# Patient Record
Sex: Female | Born: 1960 | Hispanic: No | Smoking: Former smoker
Health system: Southern US, Community
[De-identification: ages and names within clinical notes are randomized; demographics above are authoritative.]

## PROBLEM LIST (undated history)

## (undated) DIAGNOSIS — N6310 Unspecified lump in the right breast, unspecified quadrant: Secondary | ICD-10-CM

## (undated) DIAGNOSIS — G43909 Migraine, unspecified, not intractable, without status migrainosus: Secondary | ICD-10-CM

## (undated) DIAGNOSIS — M25511 Pain in right shoulder: Secondary | ICD-10-CM

## (undated) DIAGNOSIS — I6509 Occlusion and stenosis of unspecified vertebral artery: Secondary | ICD-10-CM

## (undated) DIAGNOSIS — R911 Solitary pulmonary nodule: Secondary | ICD-10-CM

## (undated) HISTORY — DX: Occlusion and stenosis of unspecified vertebral artery: I65.09

## (undated) HISTORY — DX: Pain in right shoulder: M25.511

## (undated) HISTORY — DX: Solitary pulmonary nodule: R91.1

---

## 1998-03-19 ENCOUNTER — Ambulatory Visit (HOSPITAL_COMMUNITY): Admission: RE | Admit: 1998-03-19 | Discharge: 1998-03-19 | Payer: Self-pay | Admitting: Family Medicine

## 1998-05-03 ENCOUNTER — Ambulatory Visit (HOSPITAL_COMMUNITY): Admission: RE | Admit: 1998-05-03 | Discharge: 1998-05-03 | Payer: Self-pay | Admitting: Obstetrics

## 1998-06-21 ENCOUNTER — Ambulatory Visit (HOSPITAL_COMMUNITY): Admission: RE | Admit: 1998-06-21 | Discharge: 1998-06-21 | Payer: Self-pay | Admitting: *Deleted

## 1998-11-05 ENCOUNTER — Inpatient Hospital Stay (HOSPITAL_COMMUNITY): Admission: AD | Admit: 1998-11-05 | Discharge: 1998-11-05 | Payer: Self-pay | Admitting: *Deleted

## 1998-11-08 ENCOUNTER — Inpatient Hospital Stay (HOSPITAL_COMMUNITY): Admission: AD | Admit: 1998-11-08 | Discharge: 1998-11-11 | Payer: Self-pay | Admitting: Obstetrics & Gynecology

## 1998-11-09 HISTORY — PX: TUBAL LIGATION: SHX77

## 2000-02-18 ENCOUNTER — Encounter: Payer: Self-pay | Admitting: Emergency Medicine

## 2000-02-18 ENCOUNTER — Emergency Department (HOSPITAL_COMMUNITY): Admission: EM | Admit: 2000-02-18 | Discharge: 2000-02-18 | Payer: Self-pay | Admitting: Emergency Medicine

## 2000-06-08 ENCOUNTER — Ambulatory Visit (HOSPITAL_COMMUNITY): Admission: RE | Admit: 2000-06-08 | Discharge: 2000-06-08 | Payer: Self-pay | Admitting: Internal Medicine

## 2001-03-08 ENCOUNTER — Encounter: Payer: Self-pay | Admitting: Family Medicine

## 2001-03-08 ENCOUNTER — Ambulatory Visit (HOSPITAL_COMMUNITY): Admission: RE | Admit: 2001-03-08 | Discharge: 2001-03-08 | Payer: Self-pay | Admitting: Family Medicine

## 2002-02-04 ENCOUNTER — Emergency Department (HOSPITAL_COMMUNITY): Admission: EM | Admit: 2002-02-04 | Discharge: 2002-02-04 | Payer: Self-pay | Admitting: Emergency Medicine

## 2002-07-15 ENCOUNTER — Encounter: Admission: RE | Admit: 2002-07-15 | Discharge: 2002-07-15 | Payer: Self-pay | Admitting: Family Medicine

## 2002-07-15 ENCOUNTER — Encounter: Payer: Self-pay | Admitting: Family Medicine

## 2002-09-25 ENCOUNTER — Emergency Department (HOSPITAL_COMMUNITY): Admission: EM | Admit: 2002-09-25 | Discharge: 2002-09-25 | Payer: Self-pay | Admitting: Emergency Medicine

## 2003-07-05 ENCOUNTER — Encounter: Admission: RE | Admit: 2003-07-05 | Discharge: 2003-07-05 | Payer: Self-pay | Admitting: Family Medicine

## 2003-07-05 ENCOUNTER — Encounter: Payer: Self-pay | Admitting: Family Medicine

## 2005-04-09 ENCOUNTER — Encounter: Admission: RE | Admit: 2005-04-09 | Discharge: 2005-04-09 | Payer: Self-pay | Admitting: Emergency Medicine

## 2011-07-04 ENCOUNTER — Other Ambulatory Visit: Payer: Self-pay | Admitting: Family Medicine

## 2011-07-09 ENCOUNTER — Other Ambulatory Visit: Payer: Self-pay

## 2011-07-09 ENCOUNTER — Emergency Department (HOSPITAL_COMMUNITY)
Admission: EM | Admit: 2011-07-09 | Discharge: 2011-07-09 | Disposition: A | Discharge status: Home / Self Care | Payer: Medicaid Other | Attending: Emergency Medicine | Admitting: Emergency Medicine

## 2011-07-09 DIAGNOSIS — M545 Low back pain, unspecified: Secondary | ICD-10-CM | POA: Insufficient documentation

## 2011-07-23 ENCOUNTER — Other Ambulatory Visit: Payer: Self-pay | Admitting: Family Medicine

## 2011-07-23 DIAGNOSIS — Z1231 Encounter for screening mammogram for malignant neoplasm of breast: Secondary | ICD-10-CM

## 2011-07-25 ENCOUNTER — Other Ambulatory Visit: Payer: Self-pay | Admitting: Family Medicine

## 2011-07-25 ENCOUNTER — Ambulatory Visit
Admission: RE | Admit: 2011-07-25 | Discharge: 2011-07-25 | Disposition: A | Discharge status: Home / Self Care | Payer: Medicaid Other | Source: Ambulatory Visit | Attending: Family Medicine | Admitting: Family Medicine

## 2011-07-25 ENCOUNTER — Ambulatory Visit: Payer: Medicaid Other

## 2011-07-30 ENCOUNTER — Other Ambulatory Visit: Payer: Medicaid Other

## 2011-07-30 ENCOUNTER — Other Ambulatory Visit: Payer: Self-pay | Admitting: Family Medicine

## 2011-07-30 ENCOUNTER — Ambulatory Visit
Admission: RE | Admit: 2011-07-30 | Discharge: 2011-07-30 | Disposition: A | Discharge status: Home / Self Care | Payer: Medicaid Other | Source: Ambulatory Visit | Attending: Family Medicine | Admitting: Family Medicine

## 2011-07-30 ENCOUNTER — Other Ambulatory Visit: Payer: Self-pay | Admitting: Diagnostic Radiology

## 2011-07-31 ENCOUNTER — Ambulatory Visit
Admission: RE | Admit: 2011-07-31 | Discharge: 2011-07-31 | Disposition: A | Discharge status: Home / Self Care | Payer: Medicaid Other | Source: Ambulatory Visit | Attending: Family Medicine | Admitting: Family Medicine

## 2011-08-11 ENCOUNTER — Ambulatory Visit (INDEPENDENT_AMBULATORY_CARE_PROVIDER_SITE_OTHER): Payer: Medicaid Other | Admitting: Surgery

## 2011-08-11 ENCOUNTER — Encounter (INDEPENDENT_AMBULATORY_CARE_PROVIDER_SITE_OTHER): Payer: Self-pay | Admitting: Surgery

## 2011-08-11 VITALS — BP 122/80 | HR 64 | Temp 96.9°F | Resp 14 | Ht 64.0 in | Wt 144.8 lb

## 2011-08-11 DIAGNOSIS — N63 Unspecified lump in unspecified breast: Secondary | ICD-10-CM

## 2011-08-11 DIAGNOSIS — N6310 Unspecified lump in the right breast, unspecified quadrant: Secondary | ICD-10-CM

## 2011-08-11 NOTE — Progress Notes (Signed)
Chief Complaint  Patient presents with  . Other    Eval of right breast mass    HPI Jamie Madden is a 50 y.o. female.   HPIPleasant female referred by Dr. Guinevere Ferrari for evaluation of a right breast mass. She palpated a mass herself recently. She has since had mammograms and ultrasounds as well as biopsy of the mass. She has no previous history of breast problems or breast surgery  Past Medical History  Diagnosis Date  . Arthritis   . Family history of breast cancer     cousin    Past Surgical History  Procedure Date  . Tubal ligation 11/09/1998    Family History  Problem Relation Age of Onset  . Heart disease Father 7    heart attack  . Cancer Maternal Aunt     breast    Social History History  Substance Use Topics  . Smoking status: Current Everyday Smoker -- 0.5 packs/day  . Smokeless tobacco: Never Used  . Alcohol Use: No    Allergies  Allergen Reactions  . Aspirin Nausea Only and Other (See Comments)    Stomach cramps    No current outpatient prescriptions on file.    Review of Systems Review of Systems  Constitutional: Negative.   HENT: Negative.   Eyes: Negative.   Respiratory: Negative.   Cardiovascular: Negative.   Gastrointestinal: Negative.   Genitourinary: Negative.   Musculoskeletal: Negative.   Neurological: Negative.   Hematological: Negative.   Psychiatric/Behavioral: Negative.     Blood pressure 122/80, pulse 64, temperature 96.9 F (36.1 C), temperature source Temporal, resp. rate 14, height 5\' 4"  (1.626 m), weight 144 lb 12.8 oz (65.681 kg).  Physical Exam Physical Exam  Constitutional: She is oriented to person, place, and time. She appears well-developed and well-nourished. No distress.  HENT:  Head: Normocephalic and atraumatic.  Right Ear: External ear normal.  Left Ear: External ear normal.  Nose: Nose normal.  Mouth/Throat: Oropharynx is clear and moist. No oropharyngeal exudate.  Eyes: Conjunctivae are normal. Pupils  are equal, round, and reactive to light. No scleral icterus.  Neck: Normal range of motion. Neck supple. No tracheal deviation present. No thyromegaly present.  Cardiovascular: Normal rate, regular rhythm, normal heart sounds and intact distal pulses.   No murmur heard. Pulmonary/Chest: Effort normal and breath sounds normal. She has no wheezes.  Abdominal: Soft. Bowel sounds are normal. She exhibits no distension. There is no tenderness.  Musculoskeletal: Normal range of motion. She exhibits no edema and no tenderness.  Lymphadenopathy:    She has no cervical adenopathy.  Neurological: She is alert and oriented to person, place, and time.  Skin: Skin is warm and dry. No rash noted. No erythema.  Psychiatric: Her behavior is normal. Judgment normal.   A bilateral breast examination was performed. There are no palpable left breast masses. There is a 1-1/2-2 cm firm mobile mass at the 3:00 position of the right breast. There is no axillary enlarged lymph nodes on either side. A real are normal. 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 I discussed this with her in detail. She is eager to proceed given her family history of breast cancer. I discussed the risks  of surgery with her which includes, but is not limited to, bleeding, infection, injury to surrounding structures, need for further surgery if cancer is present, et Karie Soda. Likelihood of complete removal and surgical success is likely. Surgery was scheduled       Pinkie Manger A 08/11/2011, 4:41 PM

## 2011-08-14 ENCOUNTER — Encounter (HOSPITAL_COMMUNITY)
Admission: RE | Admit: 2011-08-14 | Discharge: 2011-08-14 | Disposition: A | Discharge status: Home / Self Care | Payer: Medicaid Other | Source: Ambulatory Visit | Attending: Surgery | Admitting: Surgery

## 2011-08-14 LAB — CBC
MCH: 30.1 pg (ref 26.0–34.0)
MCHC: 33.7 g/dL (ref 30.0–36.0)
MCV: 89.4 fL (ref 78.0–100.0)
Platelets: 224 10*3/uL (ref 150–400)
RDW: 12.4 % (ref 11.5–15.5)
WBC: 9.1 10*3/uL (ref 4.0–10.5)

## 2011-08-14 LAB — SURGICAL PCR SCREEN: Staphylococcus aureus: NEGATIVE

## 2011-08-20 ENCOUNTER — Other Ambulatory Visit (INDEPENDENT_AMBULATORY_CARE_PROVIDER_SITE_OTHER): Payer: Self-pay | Admitting: Surgery

## 2011-08-20 ENCOUNTER — Ambulatory Visit (HOSPITAL_COMMUNITY)
Admission: RE | Admit: 2011-08-20 | Discharge: 2011-08-20 | Disposition: A | Discharge status: Home / Self Care | Payer: Medicaid Other | Source: Ambulatory Visit | Attending: Surgery | Admitting: Surgery

## 2011-08-20 DIAGNOSIS — G43909 Migraine, unspecified, not intractable, without status migrainosus: Secondary | ICD-10-CM | POA: Insufficient documentation

## 2011-08-20 DIAGNOSIS — M129 Arthropathy, unspecified: Secondary | ICD-10-CM | POA: Insufficient documentation

## 2011-08-20 DIAGNOSIS — F17200 Nicotine dependence, unspecified, uncomplicated: Secondary | ICD-10-CM | POA: Insufficient documentation

## 2011-08-20 DIAGNOSIS — D249 Benign neoplasm of unspecified breast: Secondary | ICD-10-CM | POA: Insufficient documentation

## 2011-08-20 DIAGNOSIS — N6019 Diffuse cystic mastopathy of unspecified breast: Secondary | ICD-10-CM

## 2011-08-20 DIAGNOSIS — Z01812 Encounter for preprocedural laboratory examination: Secondary | ICD-10-CM | POA: Insufficient documentation

## 2011-08-20 HISTORY — PX: BREAST MASS EXCISION: SHX1267

## 2011-08-21 NOTE — Op Note (Signed)
  NAMESCHUYLER, [name]                  ACCOUNT NO.:  [id number]  MEDICAL RECORD NO.:  [id number]  LOCATION:  [facility]                         FACILITY:  [facility]  PHYSICIAN:  Abigail Miyamoto, M.D. DATE OF BIRTH:  1960/12/25  DATE OF PROCEDURE:  08/20/2011 DATE OF DISCHARGE:                              OPERATIVE REPORT   PREOPERATIVE DIAGNOSIS:  Right breast mass.  POSTOPERATIVE DIAGNOSIS:  Right breast mass.  PROCEDURES:  Excision of right breast mass.  SURGEON:  Abigail Miyamoto, MD  ANESTHESIA:  General and 0.25% Marcaine.  ESTIMATED BLOOD LOSS:  Minimal.  PROCEDURE IN DETAIL:  The patient was brought to the operative room, identified as Jamie Madden.  She was placed on the operating table and general seizures induced.  Her right breast was prepped and draped in the usual sterile fashion.  The palpable mass was located at 3 o'clock position of the breast several inches from the areola.  I anesthetized the skin over the top of the Marcaine.  I then made a transverse incision on the breast with a scalpel.  I took this down to the breast tissue with electrocautery.  The mass itself appeared to be consistent with fibroadenoma and was removed in its entirety with the electrocautery.  I then achieved hemostasis with cautery.  I anesthetized the wound further with Marcaine.  I then closed subcutaneous tissue with interrupted 3-0 Vicryl sutures, closed the skin with running 4-0 Monocryl.  Steri-Strips, gauze, and Tegaderm were then applied.  The patient tolerated the procedure well.  All counts were correct at the end of procedure.  The specimen was sent to pathology for evaluation.  The patient was then extubated in the operating room and sent to the recovery room in stable condition.     Abigail Miyamoto, M.D.     DB/MEDQ  D:  08/20/2011  T:  08/20/2011  Job:  [id number]  Electronically Signed by Abigail Miyamoto M.D. on 08/21/2011 12:36:05 PM

## 2011-08-26 ENCOUNTER — Encounter (INDEPENDENT_AMBULATORY_CARE_PROVIDER_SITE_OTHER): Payer: Self-pay | Admitting: Surgery

## 2011-09-01 ENCOUNTER — Encounter (INDEPENDENT_AMBULATORY_CARE_PROVIDER_SITE_OTHER): Payer: Self-pay | Admitting: Surgery

## 2011-09-01 ENCOUNTER — Other Ambulatory Visit (INDEPENDENT_AMBULATORY_CARE_PROVIDER_SITE_OTHER): Payer: Self-pay | Admitting: Surgery

## 2011-09-01 ENCOUNTER — Ambulatory Visit (INDEPENDENT_AMBULATORY_CARE_PROVIDER_SITE_OTHER): Payer: Medicaid Other | Admitting: Surgery

## 2011-09-01 VITALS — BP 132/96 | HR 68 | Temp 96.0°F | Resp 18 | Ht 64.0 in | Wt 145.2 lb

## 2011-09-01 DIAGNOSIS — Z853 Personal history of malignant neoplasm of breast: Secondary | ICD-10-CM | POA: Insufficient documentation

## 2011-09-01 DIAGNOSIS — Z09 Encounter for follow-up examination after completed treatment for conditions other than malignant neoplasm: Secondary | ICD-10-CM

## 2011-09-01 NOTE — Progress Notes (Signed)
Subjective:     Patient ID: Jamie Madden, female   DOB: 06-08-1961, 50 y.o.   MRN: [id number]  HPI She is here for her first postoperative visit status post excision of a right breast mass. She has no complaints and is doing well.  Review of Systems     Objective:   Physical Exam On examination, the incision on the right breast is well-healed without evidence of infection.  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 After discussion she would like to go ahead and reexcise the area. I discussed the risks of surgery which includes bleeding, infection, need for further surgery, et Karie Soda. Likelihood of success is good. Surgery will be scheduled

## 2011-09-12 ENCOUNTER — Encounter (HOSPITAL_BASED_OUTPATIENT_CLINIC_OR_DEPARTMENT_OTHER): Payer: Self-pay | Admitting: *Deleted

## 2011-09-12 NOTE — Pre-Procedure Instructions (Addendum)
States small cut on finger left hand  PCP - [facility]

## 2011-09-17 ENCOUNTER — Encounter (HOSPITAL_BASED_OUTPATIENT_CLINIC_OR_DEPARTMENT_OTHER): Payer: Self-pay

## 2011-09-17 ENCOUNTER — Ambulatory Visit (HOSPITAL_BASED_OUTPATIENT_CLINIC_OR_DEPARTMENT_OTHER): Payer: Medicaid Other | Admitting: Anesthesiology

## 2011-09-17 ENCOUNTER — Encounter (HOSPITAL_BASED_OUTPATIENT_CLINIC_OR_DEPARTMENT_OTHER)
Admission: RE | Disposition: A | Discharge status: Home / Self Care | Payer: Self-pay | Source: Ambulatory Visit | Attending: Surgery

## 2011-09-17 ENCOUNTER — Encounter (HOSPITAL_BASED_OUTPATIENT_CLINIC_OR_DEPARTMENT_OTHER): Payer: Self-pay | Admitting: Anesthesiology

## 2011-09-17 ENCOUNTER — Ambulatory Visit (HOSPITAL_BASED_OUTPATIENT_CLINIC_OR_DEPARTMENT_OTHER)
Admission: RE | Admit: 2011-09-17 | Discharge: 2011-09-17 | Disposition: A | Discharge status: Home / Self Care | Payer: Medicaid Other | Source: Ambulatory Visit | Attending: Surgery | Admitting: Surgery

## 2011-09-17 ENCOUNTER — Other Ambulatory Visit (INDEPENDENT_AMBULATORY_CARE_PROVIDER_SITE_OTHER): Payer: Self-pay | Admitting: Surgery

## 2011-09-17 DIAGNOSIS — N6019 Diffuse cystic mastopathy of unspecified breast: Secondary | ICD-10-CM

## 2011-09-17 DIAGNOSIS — D249 Benign neoplasm of unspecified breast: Secondary | ICD-10-CM | POA: Insufficient documentation

## 2011-09-17 DIAGNOSIS — F17200 Nicotine dependence, unspecified, uncomplicated: Secondary | ICD-10-CM | POA: Insufficient documentation

## 2011-09-17 HISTORY — PX: BREAST BIOPSY: SHX20

## 2011-09-17 HISTORY — DX: Migraine, unspecified, not intractable, without status migrainosus: G43.909

## 2011-09-17 HISTORY — DX: Unspecified lump in the right breast, unspecified quadrant: N63.10

## 2011-09-17 LAB — POCT HEMOGLOBIN-HEMACUE: Hemoglobin: 14.8 g/dL (ref 12.0–15.0)

## 2011-09-17 SURGERY — BREAST BIOPSY
Anesthesia: Choice | Site: Breast | Laterality: Right | Wound class: Clean

## 2011-09-17 MED ORDER — DROPERIDOL 2.5 MG/ML IJ SOLN
INTRAMUSCULAR | Status: DC | PRN
  Administered 2011-09-17: 0.625 mg via INTRAVENOUS

## 2011-09-17 MED ORDER — BUPIVACAINE-EPINEPHRINE 0.5% -1:200000 IJ SOLN
INTRAMUSCULAR | Status: DC | PRN
Start: 2011-09-17 — End: 2011-09-17
  Administered 2011-09-17: 15 mL

## 2011-09-17 MED ORDER — DEXAMETHASONE SODIUM PHOSPHATE 4 MG/ML IJ SOLN
INTRAMUSCULAR | Status: DC | PRN
  Administered 2011-09-17: 10 mg via INTRAVENOUS

## 2011-09-17 MED ORDER — OXYCODONE HCL 5 MG PO TABS: 5.0000 mg | ORAL_TABLET | ORAL | Status: DC | PRN

## 2011-09-17 MED ORDER — CEFAZOLIN SODIUM 1-5 GM-% IV SOLN
1.0000 g | INTRAVENOUS | Status: AC
  Administered 2011-09-17: 1 g via INTRAVENOUS

## 2011-09-17 MED ORDER — LIDOCAINE-PRILOCAINE 2.5-2.5 % EX CREA: 1.0000 "application " | TOPICAL_CREAM | Freq: Once | CUTANEOUS | Status: DC

## 2011-09-17 MED ORDER — ONDANSETRON HCL 4 MG/2ML IJ SOLN
INTRAMUSCULAR | Status: DC | PRN
  Administered 2011-09-17: 4 mg via INTRAVENOUS

## 2011-09-17 MED ORDER — PROMETHAZINE HCL 25 MG/ML IJ SOLN: 12.5000 mg | Freq: Four times a day (QID) | INTRAMUSCULAR | Status: DC | PRN

## 2011-09-17 MED ORDER — IBUPROFEN 200 MG PO TABS: 200.0000 mg | ORAL_TABLET | Freq: Four times a day (QID) | ORAL | Status: DC | PRN

## 2011-09-17 MED ORDER — SODIUM CHLORIDE 0.9 % IJ SOLN: 3.0000 mL | Freq: Two times a day (BID) | INTRAMUSCULAR | Status: DC

## 2011-09-17 MED ORDER — LACTATED RINGERS IV SOLN: 500.0000 mL | INTRAVENOUS | Status: DC

## 2011-09-17 MED ORDER — LACTATED RINGERS IV SOLN
INTRAVENOUS | Status: DC
  Administered 2011-09-17 (×2): via INTRAVENOUS

## 2011-09-17 MED ORDER — MORPHINE SULFATE 2 MG/ML IJ SOLN: 0.0500 mg/kg | INTRAMUSCULAR | Status: DC | PRN

## 2011-09-17 MED ORDER — MIDAZOLAM HCL 2 MG/2ML IJ SOLN: 1.0000 mg | INTRAMUSCULAR | Status: DC | PRN

## 2011-09-17 MED ORDER — FENTANYL CITRATE 0.05 MG/ML IJ SOLN
INTRAMUSCULAR | Status: DC | PRN
  Administered 2011-09-17: 100 ug via INTRAVENOUS

## 2011-09-17 MED ORDER — ACETAMINOPHEN 325 MG PO TABS: 650.0000 mg | ORAL_TABLET | ORAL | Status: DC | PRN

## 2011-09-17 MED ORDER — HYDROCODONE-ACETAMINOPHEN 5-325 MG PO TABS: 1.0000 | ORAL_TABLET | ORAL | Status: AC | PRN

## 2011-09-17 MED ORDER — OXYMETAZOLINE HCL 0.05 % NA SOLN: 2.0000 | Freq: Once | NASAL | Status: DC

## 2011-09-17 MED ORDER — MIDAZOLAM HCL 2 MG/2ML IJ SOLN: 0.5000 mg | INTRAMUSCULAR | Status: DC | PRN

## 2011-09-17 MED ORDER — KETOROLAC TROMETHAMINE 30 MG/ML IJ SOLN: 15.0000 mg | Freq: Once | INTRAMUSCULAR | Status: DC | PRN

## 2011-09-17 MED ORDER — SODIUM CHLORIDE 0.9 % IV SOLN: 250.0000 mL | INTRAVENOUS | Status: DC

## 2011-09-17 MED ORDER — MIDAZOLAM HCL 5 MG/5ML IJ SOLN
INTRAMUSCULAR | Status: DC | PRN
  Administered 2011-09-17: 2 mg via INTRAVENOUS

## 2011-09-17 MED ORDER — ACETAMINOPHEN 650 MG RE SUPP: 650.0000 mg | RECTAL | Status: DC | PRN

## 2011-09-17 MED ORDER — LIDOCAINE HCL (CARDIAC) 20 MG/ML IV SOLN
INTRAVENOUS | Status: DC | PRN
  Administered 2011-09-17: 60 mg via INTRAVENOUS

## 2011-09-17 MED ORDER — FENTANYL CITRATE 0.05 MG/ML IJ SOLN: 25.0000 ug | INTRAMUSCULAR | Status: DC | PRN

## 2011-09-17 MED ORDER — PROPOFOL 10 MG/ML IV EMUL
INTRAVENOUS | Status: DC | PRN
  Administered 2011-09-17: 200 mg via INTRAVENOUS

## 2011-09-17 MED ORDER — ONDANSETRON HCL 4 MG/2ML IJ SOLN: 4.0000 mg | Freq: Four times a day (QID) | INTRAMUSCULAR | Status: DC | PRN

## 2011-09-17 MED ORDER — SODIUM CHLORIDE 0.9 % IJ SOLN: 3.0000 mL | INTRAMUSCULAR | Status: DC | PRN

## 2011-09-17 MED ORDER — METOCLOPRAMIDE HCL 5 MG/ML IJ SOLN: 10.0000 mg | Freq: Once | INTRAMUSCULAR | Status: DC | PRN

## 2011-09-17 MED ORDER — ACETAMINOPHEN 10 MG/ML IV SOLN
1000.0000 mg | Freq: Once | INTRAVENOUS | Status: AC
  Administered 2011-09-17 (×2): 1000 mg via INTRAVENOUS

## 2011-09-17 MED ORDER — LACTATED RINGERS IV SOLN: INTRAVENOUS | Status: DC

## 2011-09-17 SURGICAL SUPPLY — 47 items
APL SKNCLS STERI-STRIP NONHPOA (GAUZE/BANDAGES/DRESSINGS) ×1
BENZOIN TINCTURE PRP APPL 2/3 (GAUZE/BANDAGES/DRESSINGS) ×2 IMPLANT
BLADE HEX COATED 2.75 (ELECTRODE) ×2 IMPLANT
BLADE SURG 15 STRL LF DISP TIS (BLADE) ×1 IMPLANT
BLADE SURG 15 STRL SS (BLADE) ×2
CANISTER SUCTION 1200CC (MISCELLANEOUS) IMPLANT
CHLORAPREP W/TINT 26ML (MISCELLANEOUS) ×2 IMPLANT
CLIP TI WIDE RED SMALL 6 (CLIP) IMPLANT
CLOTH BEACON ORANGE TIMEOUT ST (SAFETY) ×2 IMPLANT
COVER MAYO STAND STRL (DRAPES) ×2 IMPLANT
COVER TABLE BACK 60X90 (DRAPES) ×2 IMPLANT
DECANTER SPIKE VIAL GLASS SM (MISCELLANEOUS) IMPLANT
DEVICE DUBIN W/COMP PLATE 8390 (MISCELLANEOUS) IMPLANT
DRAPE PED LAPAROTOMY (DRAPES) ×2 IMPLANT
DRAPE UTILITY XL STRL (DRAPES) ×2 IMPLANT
DRSG TEGADERM 4X4.75 (GAUZE/BANDAGES/DRESSINGS) ×2 IMPLANT
ELECT REM PT RETURN 9FT ADLT (ELECTROSURGICAL) ×2
ELECTRODE REM PT RTRN 9FT ADLT (ELECTROSURGICAL) ×1 IMPLANT
GAUZE SPONGE 4X4 12PLY STRL LF (GAUZE/BANDAGES/DRESSINGS) ×2 IMPLANT
GLOVE BIO SURGEON STRL SZ7 (GLOVE) ×1 IMPLANT
GLOVE BIOGEL PI IND STRL 6.5 (GLOVE) IMPLANT
GLOVE BIOGEL PI INDICATOR 6.5 (GLOVE) ×1
GLOVE SKINSENSE NS SZ7.0 (GLOVE) ×1
GLOVE SKINSENSE STRL SZ7.0 (GLOVE) IMPLANT
GLOVE SURG SIGNA 7.5 PF LTX (GLOVE) ×2 IMPLANT
GOWN PREVENTION PLUS XLARGE (GOWN DISPOSABLE) ×1 IMPLANT
GOWN PREVENTION PLUS XXLARGE (GOWN DISPOSABLE) ×2 IMPLANT
KIT MARKER MARGIN INK (KITS) ×2 IMPLANT
NDL HYPO 25X1 1.5 SAFETY (NEEDLE) ×1 IMPLANT
NEEDLE HYPO 25X1 1.5 SAFETY (NEEDLE) ×2 IMPLANT
NS IRRIG 1000ML POUR BTL (IV SOLUTION) ×2 IMPLANT
PACK BASIN DAY SURGERY FS (CUSTOM PROCEDURE TRAY) ×2 IMPLANT
PENCIL BUTTON HOLSTER BLD 10FT (ELECTRODE) ×2 IMPLANT
SLEEVE SCD COMPRESS KNEE MED (MISCELLANEOUS) IMPLANT
SPONGE GAUZE 4X4 12PLY (GAUZE/BANDAGES/DRESSINGS) ×1 IMPLANT
SPONGE LAP 4X18 X RAY DECT (DISPOSABLE) ×2 IMPLANT
STRIP CLOSURE SKIN 1/2X4 (GAUZE/BANDAGES/DRESSINGS) ×2 IMPLANT
SUT MNCRL AB 4-0 PS2 18 (SUTURE) ×2 IMPLANT
SUT SILK 2 0 SH (SUTURE) ×2 IMPLANT
SUT VIC AB 3-0 SH 27 (SUTURE) ×2
SUT VIC AB 3-0 SH 27X BRD (SUTURE) ×1 IMPLANT
SYR CONTROL 10ML LL (SYRINGE) ×2 IMPLANT
TOWEL OR 17X24 6PK STRL BLUE (TOWEL DISPOSABLE) ×2 IMPLANT
TOWEL OR NON WOVEN STRL DISP B (DISPOSABLE) ×2 IMPLANT
TUBE CONNECTING 20X1/4 (TUBING) IMPLANT
WATER STERILE IRR 1000ML POUR (IV SOLUTION) ×1 IMPLANT
YANKAUER SUCT BULB TIP NO VENT (SUCTIONS) IMPLANT

## 2011-09-17 NOTE — Op Note (Signed)
09/17/2011  10:55 AM  PATIENT:  Jamie Madden  50 y.o. female  PRE-OPERATIVE DIAGNOSIS:  right breast mass  POST-OPERATIVE DIAGNOSIS:  right breast mass  PROCEDURE:  Procedure(s): BREAST BIOPSY  SURGEON:  Surgeon(s): Shelly Rubenstein, MD  PHYSICIAN ASSISTANT:   ASSISTANTS: none   ANESTHESIA:   local and general  EBL:  Total I/O In: 200 [I.V.:200] Out: -   BLOOD ADMINISTERED:none  DRAINS: none   LOCAL MEDICATIONS USED:  MARCAINE 15CC  SPECIMEN:  Excision  DISPOSITION OF SPECIMEN:  PATHOLOGY  COUNTS:  YES  TOURNIQUET:  * No tourniquets in log *  DICTATION: .Dragon Dictation The patient was brought to the operating room and identified as the correct patient. She was placed on the operating room table and anesthesia was induced. Her right breast and prepped and draped in the usual sterile fashion. I anesthetized the old incision with Marcaine. I then performed an elliptical incision around this older incision with the scalpel. I took this down to the breast tissue with electrocautery. I then reexcised the previous biopsy site with the cautery. This was sent to pathology for evaluation. I anesthetized the wound further with Marcaine. I irrigated with saline. Hemostasis appeared to be achieved. I then closed the subcutaneous tissue with interrupted 3-0 Vicryl sutures and closed the skin with a running 4-0 Monocryl suture. Steri-Strips and Tegaderm were applied along with gauze.  The patient was then extubated in the operating room and taken in stable condition to the recovery.  All counts were correct at the end of the procedure. PLAN OF CARE: Discharge to home after PACU  PATIENT DISPOSITION:  PACU - hemodynamically stable.   Delay start of Pharmacological VTE agent (>24hrs) due to surgical blood loss or risk of bleeding:  {YES/NO/NOT APPLICABLE:20182

## 2011-09-17 NOTE — Interval H&P Note (Signed)
History and Physical Interval Note:   09/17/2011   7:40 AM   Jamie Madden  has presented today for surgery, with the diagnosis of right breast mass  The various methods of treatment have been discussed with the patient and family. After consideration of risks, benefits and other options for treatment, the patient has consented to  Procedure(s): BREAST BIOPSY as a surgical intervention .  The patients' history has been reviewed, patient examined, no change in status, stable for surgery.  I have reviewed the patients' chart and labs.  Questions were answered to the patient's satisfaction.     Abigail Miyamoto A  MD

## 2011-09-17 NOTE — Anesthesia Preprocedure Evaluation (Addendum)
Anesthesia Evaluation  Patient identified by MRN, date of birth, ID band Patient awake    Reviewed: Allergy & Precautions, H&P , NPO status , Patient's Chart, lab work & pertinent test results, reviewed documented beta blocker date and time   Airway Mallampati: II TM Distance: >3 FB Neck ROM: full    Dental   Pulmonary Current Smoker,    Pulmonary exam normal       Cardiovascular neg cardio ROS     Neuro/Psych  Headaches, Negative Psych ROS   GI/Hepatic negative GI ROS, Neg liver ROS,   Endo/Other  Negative Endocrine ROS  Renal/GU negative Renal ROS  Genitourinary negative   Musculoskeletal   Abdominal   Peds  Hematology negative hematology ROS (+)   Anesthesia Other Findings See surgeon's H&P   Reproductive/Obstetrics negative OB ROS                           Anesthesia Physical Anesthesia Plan  ASA: II  Anesthesia Plan: General   Post-op Pain Management:    Induction: Intravenous  Airway Management Planned: LMA  Additional Equipment:   Intra-op Plan:   Post-operative Plan: Extubation in OR  Informed Consent: I have reviewed the patients History and Physical, chart, labs and discussed the procedure including the risks, benefits and alternatives for the proposed anesthesia with the patient or authorized representative who has indicated his/her understanding and acceptance.     Plan Discussed with: CRNA and Surgeon  Anesthesia Plan Comments:        Anesthesia Quick Evaluation

## 2011-09-17 NOTE — Anesthesia Postprocedure Evaluation (Signed)
Anesthesia Post Note  Patient: Jamie Madden  Procedure(s) Performed:  BREAST BIOPSY - re excision right breast mass   Anesthesia type: General  Patient location: PACU  Post pain: Pain level controlled  Post assessment: Patient's Cardiovascular Status Stable  Last Vitals:  Filed Vitals:   09/17/11 1147  BP: 138/81  Pulse: 70  Temp: 36.4 C  Resp: 16    Post vital signs: Reviewed and stable  Level of consciousness: sedated  Complications: No apparent anesthesia complications

## 2011-09-17 NOTE — Transfer of Care (Signed)
Immediate Anesthesia Transfer of Care Note  Patient: Jamie Madden  Procedure(s) Performed:  BREAST BIOPSY - re excision right breast mass   Patient Location: PACU  Anesthesia Type: General  Level of Consciousness: sedated  Airway & Oxygen Therapy: Patient Spontanous Breathing and Patient connected to face mask oxygen  Post-op Assessment: Report given to PACU RN and Post -op Vital signs reviewed and stable  Post vital signs: Reviewed and stable  Complications: No apparent anesthesia complications

## 2011-09-17 NOTE — Anesthesia Procedure Notes (Signed)
Procedure Name: LMA Insertion Date/Time: 09/17/2011 10:27 AM Performed by: Gladys Damme Pre-anesthesia Checklist: Patient identified, Timeout performed, Emergency Drugs available, Suction available and Patient being monitored Patient Re-evaluated:Patient Re-evaluated prior to inductionOxygen Delivery Method: Circle System Utilized Preoxygenation: Pre-oxygenation with 100% oxygen Intubation Type: IV induction Ventilation: Mask ventilation without difficulty LMA: LMA inserted LMA Size: 4.0 Number of attempts: 2 Tube secured with: Tape Dental Injury: Teeth and Oropharynx as per pre-operative assessment

## 2011-09-17 NOTE — H&P (Signed)
Jamie Madden is an 50 y.o. female.   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 Breast mass, right   . Migraines     Past Surgical History  Procedure Date  . Tubal ligation 11/09/1998  . Breast mass excision 08/20/2011    right    Family History  Problem Relation Age of Onset  . Heart disease Father 65    heart attack  . Cancer Maternal Aunt     breast   Social History:  reports that she has been smoking Cigarettes.  She has a 17 pack-year smoking history. She has never used smokeless tobacco. She reports that she does not drink alcohol or use illicit drugs.  Allergies:  Allergies  Allergen Reactions  . Aspirin Nausea Only and Other (See Comments)    Stomach cramps    No current facility-administered medications on file as of .   No current outpatient prescriptions on file as of .    No results found for this or any previous visit (from the past 48 hour(s)). No results found.  Review of Systems  All other systems reviewed and are negative.    Height 5\' 4"  (1.626 m), weight 145 lb (65.772 kg), last menstrual period 08/31/2011. Physical Exam  AF/vss Lungs clear bilat CV RRR Abdomen soft, NT/ND Right breast incision healing well Assessment/Plan Phylloides tumor of right breast.  Plan re-excision of biopsy sight to get negative margins.  Risks discussed in detail including need for further surgery.  Likelihood of success is good  Nechuma Boven A 09/17/2011, 7:37 AM

## 2011-09-24 ENCOUNTER — Encounter (HOSPITAL_BASED_OUTPATIENT_CLINIC_OR_DEPARTMENT_OTHER): Payer: Self-pay | Admitting: Surgery

## 2011-10-06 ENCOUNTER — Ambulatory Visit (INDEPENDENT_AMBULATORY_CARE_PROVIDER_SITE_OTHER): Payer: Medicaid Other | Admitting: Surgery

## 2011-10-06 ENCOUNTER — Encounter (INDEPENDENT_AMBULATORY_CARE_PROVIDER_SITE_OTHER): Payer: Self-pay | Admitting: Surgery

## 2011-10-06 VITALS — BP 140/92 | HR 64 | Temp 97.4°F | Resp 16 | Ht 64.0 in | Wt 145.4 lb

## 2011-10-06 DIAGNOSIS — Z09 Encounter for follow-up examination after completed treatment for conditions other than malignant neoplasm: Secondary | ICD-10-CM

## 2011-10-06 NOTE — Progress Notes (Signed)
Subjective:     Patient ID: Jamie Madden, female   DOB: 28-Jul-1961, 50 y.o.   MRN: [id number]  HPI  She is here for her followup visit status post reexcision of removal of a phyloides tumor.She is doing well and has no complaints. Review of Systems     Objective:   Physical Exam On exam, her incision is well-healed. The final pathology showed biopsy site changes but no other atypia    Assessment:     Patient status post reexcision of biopsy site    Plan:     I will see her back as needed. She will continue her monthly self examinations and yearly mammograms

## 2012-08-17 ENCOUNTER — Other Ambulatory Visit: Payer: Self-pay | Admitting: Family Medicine

## 2012-08-17 ENCOUNTER — Ambulatory Visit
Admission: RE | Admit: 2012-08-17 | Discharge: 2012-08-17 | Disposition: A | Discharge status: Home / Self Care | Payer: Medicaid Other | Source: Ambulatory Visit | Attending: Family Medicine | Admitting: Family Medicine

## 2012-08-17 DIAGNOSIS — M545 Low back pain: Secondary | ICD-10-CM

## 2012-08-17 DIAGNOSIS — Z1231 Encounter for screening mammogram for malignant neoplasm of breast: Secondary | ICD-10-CM

## 2012-09-20 ENCOUNTER — Ambulatory Visit
Admission: RE | Admit: 2012-09-20 | Discharge: 2012-09-20 | Disposition: A | Discharge status: Home / Self Care | Payer: Medicaid Other | Source: Ambulatory Visit | Attending: Family Medicine | Admitting: Family Medicine

## 2012-09-20 DIAGNOSIS — Z1231 Encounter for screening mammogram for malignant neoplasm of breast: Secondary | ICD-10-CM

## 2012-10-02 ENCOUNTER — Encounter (HOSPITAL_COMMUNITY): Payer: Self-pay | Admitting: Emergency Medicine

## 2012-10-02 ENCOUNTER — Emergency Department (HOSPITAL_COMMUNITY)
Admission: EM | Admit: 2012-10-02 | Discharge: 2012-10-02 | Disposition: A | Discharge status: Home / Self Care | Payer: Medicaid Other | Attending: Emergency Medicine | Admitting: Emergency Medicine

## 2012-10-02 DIAGNOSIS — F17200 Nicotine dependence, unspecified, uncomplicated: Secondary | ICD-10-CM | POA: Insufficient documentation

## 2012-10-02 DIAGNOSIS — W268XXA Contact with other sharp object(s), not elsewhere classified, initial encounter: Secondary | ICD-10-CM | POA: Insufficient documentation

## 2012-10-02 DIAGNOSIS — Y929 Unspecified place or not applicable: Secondary | ICD-10-CM | POA: Insufficient documentation

## 2012-10-02 DIAGNOSIS — Z8742 Personal history of other diseases of the female genital tract: Secondary | ICD-10-CM | POA: Insufficient documentation

## 2012-10-02 DIAGNOSIS — Z23 Encounter for immunization: Secondary | ICD-10-CM | POA: Insufficient documentation

## 2012-10-02 DIAGNOSIS — G43909 Migraine, unspecified, not intractable, without status migrainosus: Secondary | ICD-10-CM | POA: Insufficient documentation

## 2012-10-02 DIAGNOSIS — S60459A Superficial foreign body of unspecified finger, initial encounter: Secondary | ICD-10-CM | POA: Insufficient documentation

## 2012-10-02 DIAGNOSIS — Z79899 Other long term (current) drug therapy: Secondary | ICD-10-CM | POA: Insufficient documentation

## 2012-10-02 DIAGNOSIS — T148XXA Other injury of unspecified body region, initial encounter: Secondary | ICD-10-CM

## 2012-10-02 DIAGNOSIS — Y939 Activity, unspecified: Secondary | ICD-10-CM | POA: Insufficient documentation

## 2012-10-02 MED ORDER — TETANUS-DIPHTH-ACELL PERTUSSIS 5-2.5-18.5 LF-MCG/0.5 IM SUSP
0.5000 mL | Freq: Once | INTRAMUSCULAR | Status: AC
  Administered 2012-10-02: 0.5 mL via INTRAMUSCULAR
  Filled 2012-10-02: qty 0.5

## 2012-10-02 MED ORDER — CEPHALEXIN 500 MG PO CAPS: 500.0000 mg | ORAL_CAPSULE | Freq: Four times a day (QID) | ORAL | Status: DC

## 2012-10-02 NOTE — ED Notes (Signed)
Pt states she got a splinter in her left thumb 3 weeks ago, not sure if she got it completely out.  Now having pain in thumb radiating up into hand.

## 2012-10-02 NOTE — ED Provider Notes (Signed)
History     CSN: [id number]  Arrival date & time 10/02/12  1330   First MD Initiated Contact with Patient 10/02/12 1346      Chief Complaint  Patient presents with  . Hand Pain    (Consider location/radiation/quality/duration/timing/severity/associated sxs/prior treatment) HPI Comments: This is a 51 year old female, who presents emergency department with chief complaint of splinter in her left thumb x3 weeks. Patient states that she tried to remove the splinter on her own, but is uncertain if she removed it completely. Patient states that over the last day or so her thumb has become more painful. She is concerned about it becoming infected. Patient has not taken any medicine to alleviate her symptoms. The patient's problem is constant. The thumb pain is worsened when touched.  The history is provided by the patient. No language interpreter was used.    Past Medical History  Diagnosis Date  . Breast mass, right   . Migraines     Past Surgical History  Procedure Date  . Tubal ligation 11/09/1998  . Breast mass excision 08/20/2011    right  . Breast biopsy 09/17/2011    Procedure: BREAST BIOPSY;  Surgeon: Shelly Rubenstein, MD;  Location: [facility];  Service: General;  Laterality: Right;  re excision right breast mass     Family History  Problem Relation Age of Onset  . Heart disease Father 56    heart attack  . Cancer Maternal Aunt     breast    History  Substance Use Topics  . Smoking status: Current Every Day Smoker -- 0.5 packs/day for 34 years    Types: Cigarettes  . Smokeless tobacco: Never Used  . Alcohol Use: No    OB History    Grav Para Term Preterm Abortions TAB SAB Ect Mult Living                  Review of Systems  All other systems reviewed and are negative.    Allergies  Aspirin  Home Medications   Current Outpatient Rx  Name  Route  Sig  Dispense  Refill  . ALPRAZOLAM 0.5 MG PO TABS   Oral   Take 0.5 mg by mouth 2  (two) times daily as needed. Pain         . BISOPROLOL FUMARATE 5 MG PO TABS   Oral   Take 5 mg by mouth daily.         Marland Kitchen VITAMIN D2 2000 UNITS PO TABS   Oral   Take 1 tablet by mouth daily.         Marland Kitchen ROPINIROLE HCL 1 MG PO TABS   Oral   Take 1 mg by mouth 3 (three) times daily.           BP 155/86  Pulse 85  Temp 98.3 F (36.8 C) (Oral)  Resp 16  SpO2 100%  LMP 09/28/2012  Physical Exam  Nursing note and vitals reviewed. Constitutional: She is oriented to person, place, and time. She appears well-developed and well-nourished.  HENT:  Head: Normocephalic and atraumatic.  Eyes: Conjunctivae normal and EOM are normal.  Neck: Normal range of motion.  Cardiovascular: Normal rate.   Pulmonary/Chest: Effort normal.  Abdominal: She exhibits no distension.  Musculoskeletal: Normal range of motion.       1 mm old puncture wound on the dorsal aspect of the left, no signs of foreign body or presents on exam, the area is tender to touch,  without surrounding erythema, fluctuance, purulence.  Neurological: She is alert and oriented to person, place, and time.  Skin: Skin is dry.  Psychiatric: She has a normal mood and affect. Her behavior is normal. Judgment and thought content normal.    ED Course  Procedures (including critical care time)  Labs Reviewed - No data to display No results found.   1. Splinter in skin       MDM  51 year old female with possible foreign body in thumb. I see no evidence of the splinter in her thumb , therefore I am not going to go fishing for the remnants of the splinter. I told her that I would give her an antibiotic, and I would like her to followup with her primary care physician next week. She is agreeable with this plan. Patient is stable and ready for discharge.  Patient given Keflex.        Roxy Horseman, PA-C 10/02/12 1531

## 2012-10-05 NOTE — ED Provider Notes (Signed)
Medical screening examination/treatment/procedure(s) were performed by non-physician practitioner and as supervising physician I was immediately available for consultation/collaboration.  Zadkiel Dragan T Deisi Salonga, MD 10/05/12 1643 

## 2014-11-27 ENCOUNTER — Other Ambulatory Visit: Payer: Self-pay

## 2014-11-27 DIAGNOSIS — Z1231 Encounter for screening mammogram for malignant neoplasm of breast: Secondary | ICD-10-CM

## 2015-05-16 ENCOUNTER — Other Ambulatory Visit: Payer: Self-pay | Admitting: Physical Medicine and Rehabilitation

## 2015-05-16 DIAGNOSIS — I6501 Occlusion and stenosis of right vertebral artery: Secondary | ICD-10-CM

## 2015-06-18 ENCOUNTER — Ambulatory Visit
Admission: RE | Admit: 2015-06-18 | Discharge: 2015-06-18 | Disposition: A | Discharge status: Home / Self Care | Payer: Medicaid Other | Source: Ambulatory Visit | Attending: Physical Medicine and Rehabilitation | Admitting: Physical Medicine and Rehabilitation

## 2015-06-18 DIAGNOSIS — I6501 Occlusion and stenosis of right vertebral artery: Secondary | ICD-10-CM

## 2015-06-18 MED ORDER — IOPAMIDOL (ISOVUE-370) INJECTION 76%
75.0000 mL | Freq: Once | INTRAVENOUS | Status: AC | PRN
  Administered 2015-06-18: 75 mL via INTRAVENOUS

## 2015-06-25 ENCOUNTER — Telehealth: Payer: Self-pay | Admitting: Cardiovascular Disease

## 2015-06-25 NOTE — Telephone Encounter (Signed)
Received records from [facility] for appointment on 07/20/15 with Dr Oval Linsey.  Records given to [facility] (medical records) for Dr Blenda Mounts schedule on 07/20/15. lp

## 2015-07-20 ENCOUNTER — Ambulatory Visit (INDEPENDENT_AMBULATORY_CARE_PROVIDER_SITE_OTHER): Payer: Medicaid Other | Admitting: Cardiovascular Disease

## 2015-07-20 ENCOUNTER — Encounter: Payer: Self-pay | Admitting: Cardiovascular Disease

## 2015-07-20 ENCOUNTER — Ambulatory Visit
Admission: RE | Admit: 2015-07-20 | Discharge: 2015-07-20 | Disposition: A | Discharge status: Home / Self Care | Payer: Medicaid Other | Source: Ambulatory Visit | Attending: Cardiovascular Disease | Admitting: Cardiovascular Disease

## 2015-07-20 VITALS — BP 120/84 | HR 110 | Ht 64.0 in | Wt 153.3 lb

## 2015-07-20 DIAGNOSIS — I739 Peripheral vascular disease, unspecified: Secondary | ICD-10-CM

## 2015-07-20 DIAGNOSIS — I779 Disorder of arteries and arterioles, unspecified: Secondary | ICD-10-CM | POA: Diagnosis not present

## 2015-07-20 DIAGNOSIS — Z01818 Encounter for other preprocedural examination: Secondary | ICD-10-CM

## 2015-07-20 DIAGNOSIS — I208 Other forms of angina pectoris: Secondary | ICD-10-CM

## 2015-07-20 DIAGNOSIS — D689 Coagulation defect, unspecified: Secondary | ICD-10-CM

## 2015-07-20 DIAGNOSIS — R000 Tachycardia, unspecified: Secondary | ICD-10-CM

## 2015-07-20 DIAGNOSIS — I471 Supraventricular tachycardia: Secondary | ICD-10-CM

## 2015-07-20 DIAGNOSIS — R0609 Other forms of dyspnea: Secondary | ICD-10-CM

## 2015-07-20 DIAGNOSIS — I2089 Other forms of angina pectoris: Secondary | ICD-10-CM

## 2015-07-20 LAB — COMPREHENSIVE METABOLIC PANEL
ALK PHOS: 101 U/L (ref 33–130)
ALT: 18 U/L (ref 6–29)
AST: 18 U/L (ref 10–35)
Albumin: 4.4 g/dL (ref 3.6–5.1)
BILIRUBIN TOTAL: 1 mg/dL (ref 0.2–1.2)
BUN: 8 mg/dL (ref 7–25)
CALCIUM: 9.7 mg/dL (ref 8.6–10.4)
CO2: 27 mmol/L (ref 20–31)
CREATININE: 0.7 mg/dL (ref 0.50–1.05)
Chloride: 101 mmol/L (ref 98–110)
GLUCOSE: 88 mg/dL (ref 65–99)
Potassium: 4.1 mmol/L (ref 3.5–5.3)
SODIUM: 139 mmol/L (ref 135–146)
Total Protein: 7.4 g/dL (ref 6.1–8.1)

## 2015-07-20 LAB — CBC
HEMATOCRIT: 41 % (ref 36.0–46.0)
Hemoglobin: 14.2 g/dL (ref 12.0–15.0)
MCH: 30.2 pg (ref 26.0–34.0)
MCHC: 34.6 g/dL (ref 30.0–36.0)
MCV: 87.2 fL (ref 78.0–100.0)
MPV: 9.2 fL (ref 8.6–12.4)
PLATELETS: 267 10*3/uL (ref 150–400)
RBC: 4.7 MIL/uL (ref 3.87–5.11)
RDW: 13.3 % (ref 11.5–15.5)
WBC: 10.8 10*3/uL — AB (ref 4.0–10.5)

## 2015-07-20 MED ORDER — CLOPIDOGREL BISULFATE 75 MG PO TABS: 75.0000 mg | ORAL_TABLET | Freq: Every day | ORAL | Status: DC

## 2015-07-20 MED ORDER — ATORVASTATIN CALCIUM 80 MG PO TABS: 80.0000 mg | ORAL_TABLET | Freq: Every day | ORAL | Status: DC

## 2015-07-20 NOTE — Progress Notes (Signed)
Cardiology Office Note   Date:  07/20/2015   ID:  Jamie Madden, DOB [birth year], MRN [id number]  PCP:  [facility]  Cardiologist:   Sharol Harness, MD   Chief Complaint  Patient presents with  . New Evaluation    referred by PCP to evaluate for stress test//pt c/o chest pain, feels like"pinching" in the middle of her chest and neck, pt states she has a blockage in her neck  . Shortness of Breath    random, can be sitting or moving around, comes with the chest pain  . Numbness    bilateral arms, mainly right arm  . Dizziness    going from laying down to sitting/standing  . Edema    a month and a half ago, both legs, ankles, feet were very swollen/ has not been too bad since      History of Present Illness: Jamie Madden is a 54 y.o. female who presents for an evaluation of chest pain.  His heart reports a year of exertional chest pain and shortness of breath. The chest pain is substernal and sometimes occurs with rest but also occurs with walking. The episodes last for minutes and improved with rest. They're associated with diaphoresis but no nausea or palpitations. She sometimes notices a pinching chest pain that occurs when she lays down. Jamie Madden has noted an inability to do her house chores without shortness of breath. When mopping her floor she has to stop due to chest discomfort and shortness of breath.  Jamie Madden was evaluated by her PCP, Dr. Herbert Moors on 8/25.   She was instructed to add metoprolol to her regimen but has not started this because she does not think that she needs 3 BP medications.  At that appointment she complained of chest pain, palpitations and fatigue, so she was referred to cardiology for evaluation.  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  At her PCP's office she notes that her lipids were elevated.  She was instructed to increase her physical exercise and improve her diet. Her lipids were to be  rechecked in 3 months. She has been trying to cut back on the Friday and fatty foods. She started eating oatmeal and Cheerios. She also has increased her intake of fruits and vegetables. Jamie Madden drinks mostly water and 1-2 cups of sweet tea daily.  Jamie Madden has a very strong family history of premature coronary artery disease. Her brother had hypertension and an MI at age 37. Her sister has 2 blocked coronary arteries at age 20. And she thinks that another brother had heart disease at age 56. Her father had a myocardial infarction at age 53. She quit smoking 2 years ago. Prior to that she smoked nearly a pack a day    Past Medical History  Diagnosis Date  . Breast mass, right   . Migraines     Past Surgical History  Procedure Laterality Date  . Tubal ligation  11/09/1998  . Breast mass excision  08/20/2011    right  . Breast biopsy  09/17/2011    Procedure: BREAST BIOPSY;  Surgeon: Harl Bowie, MD;  Location: [facility];  Service: General;  Laterality: Right;  re excision right breast mass      Current Outpatient Prescriptions  Medication Sig Dispense Refill  . docusate sodium (COLACE) 100 MG capsule Take 100 mg by mouth as needed.  3  .  gabapentin (NEURONTIN) 300 MG capsule Take 300 mg by mouth 2 (two) times daily.  5  . HYDROcodone-acetaminophen (NORCO) 10-325 MG per tablet Take 1 tablet by mouth every 6 (six) hours as needed. for pain  0  . lisinopril (PRINIVIL,ZESTRIL) 10 MG tablet Take 1 tablet by mouth daily.  5  . metoprolol tartrate (LOPRESSOR) 25 MG tablet Take 25 mg by mouth 2 (two) times daily.  5  . omeprazole (PRILOSEC) 20 MG capsule Take 1 capsule by mouth as needed.  5  . PREMARIN 0.625 MG tablet Take 0.625 mg by mouth daily.  2  . atorvastatin (LIPITOR) 80 MG tablet Take 1 tablet (80 mg total) by mouth daily. 90 tablet 3  . clopidogrel (PLAVIX) 75 MG tablet Take 1 tablet (75 mg total) by mouth daily. 90 tablet 3   No current  facility-administered medications for this visit.    Allergies:   Aspirin    Social History:  The patient  reports that she has been smoking Cigarettes.  She has a 17 pack-year smoking history. She has never used smokeless tobacco. She reports that she does not drink alcohol or use illicit drugs.   Family History:  The patient's family history includes Cancer in her maternal aunt; Heart disease (age of onset: 66) in her father.  Siblings with CAD prior to age 31.  ROS:  Please see the history of present illness.   Otherwise, review of systems are positive for leg pain at night.   All other systems are reviewed and negative.    PHYSICAL EXAM: VS:  BP 120/84 mmHg  Pulse 110  Ht 5\' 4"  (1.626 m)  Wt 69.536 kg (153 lb 4.8 oz)  BMI 26.30 kg/m2 , BMI Body mass index is 26.3 kg/(m^2). GENERAL:  Well appearing HEENT:  Pupils equal round and reactive, fundi not visualized, oral mucosa unremarkable NECK:  No jugular venous distention, waveform within normal limits, carotid upstroke brisk and symmetric, no bruits, no thyromegaly LYMPHATICS:  No cervical adenopathy LUNGS:  Clear to auscultation bilaterally HEART:  RRR.  PMI not displaced or sustained,S1 and S2 within normal limits, no S3, no S4, no clicks, no rubs, no murmurs ABD:  Flat, positive bowel sounds normal in frequency in pitch, no bruits, no rebound, no guarding, no midline pulsatile mass, no hepatomegaly, no splenomegaly EXT:  2 plus pulses throughout, no edema, no cyanosis no clubbing SKIN:  No rashes no nodules NEURO:  Cranial nerves II through XII grossly intact, motor grossly intact throughout PSYCH:  Cognitively intact, oriented to person place and time    EKG:  EKG is ordered today. The ekg ordered today demonstrates sinus tachycardia.  L axis deviation.     Recent Labs: No results found for requested labs within last 365 days.    Lipid Panel No results found for: CHOL, TRIG, HDL, CHOLHDL, VLDL, LDLCALC, LDLDIRECT     Wt Readings from Last 3 Encounters:  07/20/15 69.536 kg (153 lb 4.8 oz)  10/06/11 65.942 kg (145 lb 6 oz)  09/12/11 65.772 kg (145 lb)    CT angio neck 06/18/15:   IMPRESSION: 1. The right vertebral artery appears occluded just beyond its origin at the thoracic inlet. Highly diminutive right vertebral artery then is reconstituted at the skullbase, probably in a retrograde fashion from the left side. 2. Dominant left vertebral artery with intermittent tortuosity, no stenosis. 3. Moderate to severe bilateral cervical ICA tortuosity at the C1-C2 level. No carotid atherosclerosis or stenosis in the neck.  Other studies Reviewed: Additional studies/ records that were reviewed today include: . Review of the above records demonstrates:  Please see elsewhere in the note.     ASSESSMENT AND PLAN:  # CCS Class III angina: Jamie Madden has symptoms that are very concerning for angina.  Given her typical symptoms, prior smoking history, hypertension, and strong family history of premature coronary artery disease. She would be considered to have a high pretest probability of coronary artery disease. Therefore we will proceed directly to cardiac catheterization. She has intolerance to aspirin, as it upsets her stomach. Given her known vertebral and carotid artery disease, I feel strongly that she should be on an antiplatelet agent. She also needs aggressive risk factor modification to prevent future heart attacks and strokes. - Start plavix 75mg  daily - Start metoprolol as prescribed - Start atorvastatin 80mg   - Coronary angiography (Risks and benefits of cardiac catheterization have been discussed with the patient.  The patient understands that risks included but are not limited to stroke (1 in 1000), death (1 in 45), kidney failure [usually temporary] (1 in 500), bleeding (1 in 200), allergic reaction [possibly serious] (1 in 200). The patient understands and agrees to proceed.) - Pre-cath labs  (CBC, CMP, coags).  # Hypertension: BP well-controlled.  She has not started taking metoprolol. I think that she should be on a beta blocker given her peripheral vascular disease and likely coronary artery disease. We have recommended that she go ahead and start taking metoprolol and will stop amlodipine. She will continue taking lisinopril as well.   # Hyperlipidemia: Jamie Madden reports that her cholesterol is above goal with when checked by her primary care physician. We do not have these numbers available today. Given her risk factors and known peripheral vascular disease, we will start her on atorvastatin as above. She will need repeat lipids and LFTs checked in 6 weeks.  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 We have referred her to Dr. Quay Burow for potential intervention for her peripheral vascular disease if indicated. - Start plavix and statin as above  - referral to Dr. Gwenlyn Found for management of carotid and vertebral artery disease  # Sinus tachycardia:  Jamie Madden has sinus tachycardia and reports that this is been a chronic issue for her. She denies any anxiety, fevers, chills, cough, or dysuria. In addition to starting metoprolol as above, we will check a d-dimer to rule out pulmonary embolism.  Current medicines are reviewed at length with the patient today.  The patient has concerns regarding medicines.  The following changes have been made:  Start metoprolol. Stop amlodipine.  Start plavix and atorvastatin.   Labs/ tests ordered today include:   Orders Placed This Encounter  Procedures  . DG Chest 2 View  . CBC  . Comprehensive metabolic panel  . APTT  . Protime-INR  . D-Dimer, Quantitative  . EKG 12-Lead  . LEFT HEART CATHETERIZATION WITH CORONARY ANGIOGRAM     Disposition:   FU with Tiffany C. Oval Linsey, MD after  cath.   Signed, Sharol Harness, MD  07/20/2015 2:34 PM    [place]

## 2015-07-20 NOTE — Patient Instructions (Addendum)
LEFT HEART  WITH DR [facility] physician has requested that you have a cardiac catheterization. Cardiac catheterization is used to diagnose and/or treat various heart conditions. Doctors may recommend this procedure for a number of different reasons. The most common reason is to evaluate chest pain. Chest pain can be a symptom of coronary artery disease (CAD), and cardiac catheterization can show whether plaque is narrowing or blocking your heart's arteries. This procedure is also used to evaluate the valves, as well as measure the blood flow and oxygen levels in different parts of your heart. For further information please visit [email or web address]. Please follow instruction sheet, as given.  LABS -CMP,PT,PTT,CBC,D- DIMER  [address]   NEED CHEST X RAY - [address]   STOP TAKING AMLODIPINE   START CLOPIDOGREL (PLAVIX) 75 MG ONE TABLET DAILY ATORVASTATIN 80 MG ONE TABLET AT BEDTIME  .You have been referred to  Dr Quay Burow -- REGARDING CAROTID ARTERY  Your physician recommends that you schedule a follow-up appointment WITH Dr [facility] AFTER CARDIAC CATH.

## 2015-07-21 LAB — D-DIMER, QUANTITATIVE: D-Dimer, Quant: 0.61 ug/mL-FEU — ABNORMAL HIGH (ref 0.00–0.48)

## 2015-07-21 LAB — APTT: aPTT: 34 seconds (ref 24–37)

## 2015-07-21 LAB — PROTIME-INR
INR: 0.91 (ref <1.50)
Prothrombin Time: 12.3 seconds (ref 11.6–15.2)

## 2015-07-25 ENCOUNTER — Ambulatory Visit (HOSPITAL_COMMUNITY)
Admission: RE | Admit: 2015-07-25 | Discharge: 2015-07-25 | Disposition: A | Discharge status: Home / Self Care | Payer: Medicaid Other | Source: Ambulatory Visit | Attending: Cardiovascular Disease | Admitting: Cardiovascular Disease

## 2015-07-25 ENCOUNTER — Telehealth: Payer: Self-pay | Admitting: *Deleted

## 2015-07-25 ENCOUNTER — Other Ambulatory Visit: Payer: Self-pay | Admitting: *Deleted

## 2015-07-25 DIAGNOSIS — R7989 Other specified abnormal findings of blood chemistry: Secondary | ICD-10-CM

## 2015-07-25 DIAGNOSIS — R791 Abnormal coagulation profile: Secondary | ICD-10-CM | POA: Insufficient documentation

## 2015-07-25 DIAGNOSIS — R0602 Shortness of breath: Secondary | ICD-10-CM | POA: Diagnosis not present

## 2015-07-25 DIAGNOSIS — Z01818 Encounter for other preprocedural examination: Secondary | ICD-10-CM

## 2015-07-25 DIAGNOSIS — R9389 Abnormal findings on diagnostic imaging of other specified body structures: Secondary | ICD-10-CM

## 2015-07-25 MED ORDER — TECHNETIUM TC 99M DIETHYLENETRIAME-PENTAACETIC ACID: 40.0000 | Freq: Once | INTRAVENOUS | Status: DC | PRN

## 2015-07-25 MED ORDER — TECHNETIUM TO 99M ALBUMIN AGGREGATED
6.0000 | Freq: Once | INTRAVENOUS | Status: AC | PRN
  Administered 2015-07-25: 6 via INTRAVENOUS

## 2015-07-25 NOTE — Telephone Encounter (Signed)
-----   Message from Skeet Latch, MD sent at 07/23/2015 10:45 PM EDT ----- D-dimer was slightly elevated, which is concerning for possible blood clot or pulmonary embolism.  This could explain why her heart rate is high.  Please send for V/Q scan (ventilation-profusion scan) to rule out pulmonary emboslism.

## 2015-07-25 NOTE — Telephone Encounter (Signed)
Spoke to patient. Result given . Verbalized understanding RN informed patient needs a VQ SCAN schedule today or tomorrow She will able to do VQ SCAN,scheduler aware

## 2015-07-25 NOTE — Telephone Encounter (Signed)
-----   Message from Skeet Latch, MD sent at 07/23/2015 10:53 PM EDT ----- Mild degenerative disc disease.  No abnormalities of the heart of lungs.

## 2015-07-25 NOTE — Telephone Encounter (Signed)
LEFT MESSAGE TO CALL BACK- REGARDS TO LABS AND XRAY

## 2015-07-25 NOTE — Telephone Encounter (Signed)
ORDER PLACED FOR CHESTX- RAY  NEEDS TO TO BE 24 HOUR PRIOR TO VQ SCAN

## 2015-07-26 ENCOUNTER — Telehealth: Payer: Self-pay | Admitting: *Deleted

## 2015-07-26 NOTE — Telephone Encounter (Signed)
-----   Message from Skeet Latch, MD sent at 07/26/2015  9:25 AM EDT ----- Normal study. No pulmonary embolism.

## 2015-07-26 NOTE — Telephone Encounter (Signed)
Spoke to patient. Result given . Verbalized understanding  

## 2015-07-26 NOTE — Telephone Encounter (Signed)
-----   Message from Skeet Latch, MD sent at 07/26/2015  9:24 AM EDT ----- Normal chest xray.

## 2015-07-27 ENCOUNTER — Other Ambulatory Visit: Payer: Self-pay | Admitting: *Deleted

## 2015-07-27 DIAGNOSIS — Z01818 Encounter for other preprocedural examination: Secondary | ICD-10-CM

## 2015-08-02 ENCOUNTER — Ambulatory Visit (HOSPITAL_COMMUNITY)
Admission: RE | Admit: 2015-08-02 | Discharge: 2015-08-02 | Disposition: A | Discharge status: Home / Self Care | Payer: Medicaid Other | Source: Ambulatory Visit | Attending: Cardiovascular Disease | Admitting: Cardiovascular Disease

## 2015-08-02 ENCOUNTER — Encounter: Payer: Self-pay | Admitting: Cardiovascular Disease

## 2015-08-02 ENCOUNTER — Encounter (HOSPITAL_COMMUNITY)
Admission: RE | Disposition: A | Discharge status: Home / Self Care | Payer: Self-pay | Source: Ambulatory Visit | Attending: Cardiovascular Disease

## 2015-08-02 DIAGNOSIS — R000 Tachycardia, unspecified: Secondary | ICD-10-CM | POA: Diagnosis not present

## 2015-08-02 DIAGNOSIS — F17210 Nicotine dependence, cigarettes, uncomplicated: Secondary | ICD-10-CM | POA: Insufficient documentation

## 2015-08-02 DIAGNOSIS — G43909 Migraine, unspecified, not intractable, without status migrainosus: Secondary | ICD-10-CM | POA: Diagnosis not present

## 2015-08-02 DIAGNOSIS — Z8249 Family history of ischemic heart disease and other diseases of the circulatory system: Secondary | ICD-10-CM | POA: Diagnosis not present

## 2015-08-02 DIAGNOSIS — I209 Angina pectoris, unspecified: Secondary | ICD-10-CM | POA: Diagnosis not present

## 2015-08-02 DIAGNOSIS — I10 Essential (primary) hypertension: Secondary | ICD-10-CM | POA: Insufficient documentation

## 2015-08-02 DIAGNOSIS — Z7902 Long term (current) use of antithrombotics/antiplatelets: Secondary | ICD-10-CM | POA: Diagnosis not present

## 2015-08-02 DIAGNOSIS — R079 Chest pain, unspecified: Secondary | ICD-10-CM | POA: Diagnosis present

## 2015-08-02 DIAGNOSIS — I739 Peripheral vascular disease, unspecified: Secondary | ICD-10-CM | POA: Diagnosis not present

## 2015-08-02 DIAGNOSIS — E785 Hyperlipidemia, unspecified: Secondary | ICD-10-CM | POA: Diagnosis not present

## 2015-08-02 DIAGNOSIS — Z01818 Encounter for other preprocedural examination: Secondary | ICD-10-CM

## 2015-08-02 HISTORY — PX: CARDIAC CATHETERIZATION: SHX172

## 2015-08-02 SURGERY — LEFT HEART CATH AND CORONARY ANGIOGRAPHY
Anesthesia: LOCAL

## 2015-08-02 MED ORDER — FENTANYL CITRATE (PF) 100 MCG/2ML IJ SOLN
INTRAMUSCULAR | Status: AC
  Filled 2015-08-02: qty 4

## 2015-08-02 MED ORDER — LIDOCAINE HCL (PF) 1 % IJ SOLN
INTRAMUSCULAR | Status: DC | PRN
  Administered 2015-08-02: 15:00:00

## 2015-08-02 MED ORDER — SODIUM CHLORIDE 0.9 % IV SOLN: 250.0000 mL | INTRAVENOUS | Status: DC | PRN

## 2015-08-02 MED ORDER — METHYLPREDNISOLONE SODIUM SUCC 125 MG IJ SOLR
INTRAMUSCULAR | Status: AC
  Filled 2015-08-02: qty 2

## 2015-08-02 MED ORDER — MIDAZOLAM HCL 2 MG/2ML IJ SOLN
INTRAMUSCULAR | Status: AC
  Filled 2015-08-02: qty 4

## 2015-08-02 MED ORDER — FENTANYL CITRATE (PF) 100 MCG/2ML IJ SOLN
INTRAMUSCULAR | Status: DC | PRN
  Administered 2015-08-02 (×2): 25 ug via INTRAVENOUS

## 2015-08-02 MED ORDER — HEPARIN SODIUM (PORCINE) 1000 UNIT/ML IJ SOLN
INTRAMUSCULAR | Status: DC | PRN
  Administered 2015-08-02: 3500 [IU] via INTRAVENOUS

## 2015-08-02 MED ORDER — MORPHINE SULFATE (PF) 2 MG/ML IV SOLN: 2.0000 mg | INTRAVENOUS | Status: DC | PRN

## 2015-08-02 MED ORDER — VERAPAMIL HCL 2.5 MG/ML IV SOLN
INTRA_ARTERIAL | Status: DC | PRN
  Administered 2015-08-02: 15:00:00 via INTRA_ARTERIAL

## 2015-08-02 MED ORDER — NITROGLYCERIN 1 MG/10 ML FOR IR/CATH LAB
INTRA_ARTERIAL | Status: AC
Start: 2015-08-02 — End: 2015-08-02
  Filled 2015-08-02: qty 10

## 2015-08-02 MED ORDER — VERAPAMIL HCL 2.5 MG/ML IV SOLN
INTRAVENOUS | Status: AC
Start: 2015-08-02 — End: 2015-08-02
  Filled 2015-08-02: qty 2

## 2015-08-02 MED ORDER — SODIUM CHLORIDE 0.9 % IJ SOLN: 3.0000 mL | Freq: Two times a day (BID) | INTRAMUSCULAR | Status: DC

## 2015-08-02 MED ORDER — LIDOCAINE HCL (PF) 1 % IJ SOLN
INTRAMUSCULAR | Status: AC
  Filled 2015-08-02: qty 30

## 2015-08-02 MED ORDER — HEPARIN (PORCINE) IN NACL 2-0.9 UNIT/ML-% IJ SOLN
INTRAMUSCULAR | Status: AC
Start: 2015-08-02 — End: 2015-08-02
  Filled 2015-08-02: qty 1000

## 2015-08-02 MED ORDER — ACETAMINOPHEN 325 MG PO TABS: 650.0000 mg | ORAL_TABLET | ORAL | Status: DC | PRN

## 2015-08-02 MED ORDER — SODIUM CHLORIDE 0.9 % IJ SOLN: 3.0000 mL | INTRAMUSCULAR | Status: DC | PRN

## 2015-08-02 MED ORDER — ONDANSETRON HCL 4 MG/2ML IJ SOLN: 4.0000 mg | Freq: Four times a day (QID) | INTRAMUSCULAR | Status: DC | PRN

## 2015-08-02 MED ORDER — SODIUM CHLORIDE 0.9 % IV SOLN
INTRAVENOUS | Status: DC
  Administered 2015-08-02: 14:00:00 via INTRAVENOUS

## 2015-08-02 MED ORDER — SODIUM CHLORIDE 0.9 % WEIGHT BASED INFUSION: 3.0000 mL/kg/h | INTRAVENOUS | Status: DC

## 2015-08-02 MED ORDER — HEPARIN SODIUM (PORCINE) 1000 UNIT/ML IJ SOLN
INTRAMUSCULAR | Status: AC
  Filled 2015-08-02: qty 1

## 2015-08-02 MED ORDER — MIDAZOLAM HCL 2 MG/2ML IJ SOLN
INTRAMUSCULAR | Status: DC | PRN
  Administered 2015-08-02: 1 mg via INTRAVENOUS

## 2015-08-02 MED ORDER — METHYLPREDNISOLONE SODIUM SUCC 125 MG IJ SOLR
60.0000 mg | Freq: Once | INTRAMUSCULAR | Status: AC
  Administered 2015-08-02: 60 mg via INTRAVENOUS

## 2015-08-02 MED ORDER — IOHEXOL 350 MG/ML SOLN
INTRAVENOUS | Status: DC | PRN
  Administered 2015-08-02: 65 mL via INTRACARDIAC

## 2015-08-02 SURGICAL SUPPLY — 11 items

## 2015-08-02 NOTE — Research (Signed)
[place] Study Informed Consent   Subject Name: Jamie Madden  Subject met inclusion and exclusion criteria.  The informed consent form, study requirements and expectations were reviewed with the subject and questions and concerns were addressed prior to the signing of the consent form.  The subject verbalized understanding of the trial requirements.  The subject agreed to participate in the [place] trial and signed the informed consent at 1345 on 08/02/2015.  The informed consent was obtained prior to performance of any protocol-specific procedures for the subject.  A copy of the signed informed consent was given to the subject and a copy was placed in the subject's medical record.  Blossom Hoops 08/02/2015, 2:38 PM

## 2015-08-02 NOTE — Discharge Instructions (Signed)
Radial Site Care °Refer to this sheet in the next few weeks. These instructions provide you with information about caring for yourself after your procedure. Your health care provider may also give you more specific instructions. Your treatment has been planned according to current medical practices, but problems sometimes occur. Call your health care provider if you have any problems or questions after your procedure. °WHAT TO EXPECT AFTER THE PROCEDURE °After your procedure, it is typical to have the following: °· Bruising at the radial site that usually fades within 1-2 weeks. °· Blood collecting in the tissue (hematoma) that may be painful to the touch. It should usually decrease in size and tenderness within 1-2 weeks. °HOME CARE INSTRUCTIONS °· Take medicines only as directed by your health care provider. °· You may shower 24-48 hours after the procedure or as directed by your health care provider. Remove the bandage (dressing) and gently wash the site with plain soap and water. Pat the area dry with a clean towel. Do not rub the site, because this may cause bleeding. °· Do not take baths, swim, or use a hot tub until your health care provider approves. °· Check your insertion site every day for redness, swelling, or drainage. °· Do not apply powder or lotion to the site. °· Do not flex or bend the affected arm for 24 hours or as directed by your health care provider. °· Do not push or pull heavy objects with the affected arm for 24 hours or as directed by your health care provider. °· Do not lift over 10 lb (4.5 kg) for 5 days after your procedure or as directed by your health care provider. °· Ask your health care provider when it is okay to: °¨ Return to work or school. °¨ Resume usual physical activities or sports. °¨ Resume sexual activity. °· Do not drive home if you are discharged the same day as the procedure. Have someone else drive you. °· You may drive 24 hours after the procedure unless otherwise  instructed by your health care provider. °· Do not operate machinery or power tools for 24 hours after the procedure. °· If your procedure was done as an outpatient procedure, which means that you went home the same day as your procedure, a responsible adult should be with you for the first 24 hours after you arrive home. °· Keep all follow-up visits as directed by your health care provider. This is important. °SEEK MEDICAL CARE IF: °· You have a fever. °· You have chills. °· You have increased bleeding from the radial site. Hold pressure on the site. °SEEK IMMEDIATE MEDICAL CARE IF: °· You have unusual pain at the radial site. °· You have redness, warmth, or swelling at the radial site. °· You have drainage (other than a small amount of blood on the dressing) from the radial site. °· The radial site is bleeding, and the bleeding does not stop after 30 minutes of holding steady pressure on the site. °· Your arm or hand becomes pale, cool, tingly, or numb. °  °This information is not intended to replace advice given to you by your health care provider. Make sure you discuss any questions you have with your health care provider. °  °Document Released: 11/15/2010 Document Revised: 11/03/2014 Document Reviewed: 05/01/2014 °Elsevier Interactive Patient Education ©2016 [employer] ° °

## 2015-08-02 NOTE — H&P (View-Only) (Signed)
Cardiology Office Note   Date:  07/20/2015   ID:  MAHINA Madden, DOB 19-Sep-1961, MRN [id number]  PCP:  [facility]  Cardiologist:   Sharol Harness, MD   Chief Complaint  Patient presents with  . New Evaluation    referred by PCP to evaluate for stress test//pt c/o chest pain, feels like"pinching" in the middle of her chest and neck, pt states she has a blockage in her neck  . Shortness of Breath    random, can be sitting or moving around, comes with the chest pain  . Numbness    bilateral arms, mainly right arm  . Dizziness    going from laying down to sitting/standing  . Edema    a month and a half ago, both legs, ankles, feet were very swollen/ has not been too bad since      History of Present Illness: Jamie Madden is a 54 y.o. female who presents for an evaluation of chest pain.  His heart reports a year of exertional chest pain and shortness of breath. The chest pain is substernal and sometimes occurs with rest but also occurs with walking. The episodes last for minutes and improved with rest. They're associated with diaphoresis but no nausea or palpitations. She sometimes notices a pinching chest pain that occurs when she lays down. [name] has noted an inability to do her house chores without shortness of breath. When mopping her floor she has to stop due to chest discomfort and shortness of breath.  [name] was evaluated by her PCP, Dr. Herbert Moors on 8/25.   She was instructed to add metoprolol to her regimen but has not started this because she does not think that she needs 3 BP medications.  At that appointment she complained of chest pain, palpitations and fatigue, so she was referred to cardiology for evaluation.  [name] was noted to have an occluded R vertebral artery on CT-A on 06/18/15.  At her PCP's office she notes that her lipids were elevated.  She was instructed to increase her physical exercise and improve her diet. Her lipids were to be  rechecked in 3 months. She has been trying to cut back on the Friday and fatty foods. She started eating oatmeal and Cheerios. She also has increased her intake of fruits and vegetables. [name] drinks mostly water and 1-2 cups of sweet tea daily.  [name] has a very strong family history of premature coronary artery disease. Her brother had hypertension and an MI at age 63. Her sister has 2 blocked coronary arteries at age 92. And she thinks that another brother had heart disease at age 59. Her father had a myocardial infarction at age 92. She quit smoking 2 years ago. Prior to that she smoked nearly a pack a day    Past Medical History  Diagnosis Date  . Breast mass, right   . Migraines     Past Surgical History  Procedure Laterality Date  . Tubal ligation  11/09/1998  . Breast mass excision  08/20/2011    right  . Breast biopsy  09/17/2011    Procedure: BREAST BIOPSY;  Surgeon: Harl Bowie, MD;  Location: [facility];  Service: General;  Laterality: Right;  re excision right breast mass      Current Outpatient Prescriptions  Medication Sig Dispense Refill  . docusate sodium (COLACE) 100 MG capsule Take 100 mg by mouth as needed.  3  .  gabapentin (NEURONTIN) 300 MG capsule Take 300 mg by mouth 2 (two) times daily.  5  . HYDROcodone-acetaminophen (NORCO) 10-325 MG per tablet Take 1 tablet by mouth every 6 (six) hours as needed. for pain  0  . lisinopril (PRINIVIL,ZESTRIL) 10 MG tablet Take 1 tablet by mouth daily.  5  . metoprolol tartrate (LOPRESSOR) 25 MG tablet Take 25 mg by mouth 2 (two) times daily.  5  . omeprazole (PRILOSEC) 20 MG capsule Take 1 capsule by mouth as needed.  5  . PREMARIN 0.625 MG tablet Take 0.625 mg by mouth daily.  2  . atorvastatin (LIPITOR) 80 MG tablet Take 1 tablet (80 mg total) by mouth daily. 90 tablet 3  . clopidogrel (PLAVIX) 75 MG tablet Take 1 tablet (75 mg total) by mouth daily. 90 tablet 3   No current  facility-administered medications for this visit.    Allergies:   Aspirin    Social History:  The patient  reports that she has been smoking Cigarettes.  She has a 17 pack-year smoking history. She has never used smokeless tobacco. She reports that she does not drink alcohol or use illicit drugs.   Family History:  The patient's family history includes Cancer in her maternal aunt; Heart disease (age of onset: 61) in her father.  Siblings with CAD prior to age 77.  ROS:  Please see the history of present illness.   Otherwise, review of systems are positive for leg pain at night.   All other systems are reviewed and negative.    PHYSICAL EXAM: VS:  BP 120/84 mmHg  Pulse 110  Ht 5\' 4"  (1.626 m)  Wt 69.536 kg (153 lb 4.8 oz)  BMI 26.30 kg/m2 , BMI Body mass index is 26.3 kg/(m^2). GENERAL:  Well appearing HEENT:  Pupils equal round and reactive, fundi not visualized, oral mucosa unremarkable NECK:  No jugular venous distention, waveform within normal limits, carotid upstroke brisk and symmetric, no bruits, no thyromegaly LYMPHATICS:  No cervical adenopathy LUNGS:  Clear to auscultation bilaterally HEART:  RRR.  PMI not displaced or sustained,S1 and S2 within normal limits, no S3, no S4, no clicks, no rubs, no murmurs ABD:  Flat, positive bowel sounds normal in frequency in pitch, no bruits, no rebound, no guarding, no midline pulsatile mass, no hepatomegaly, no splenomegaly EXT:  2 plus pulses throughout, no edema, no cyanosis no clubbing SKIN:  No rashes no nodules NEURO:  Cranial nerves II through XII grossly intact, motor grossly intact throughout PSYCH:  Cognitively intact, oriented to person place and time    EKG:  EKG is ordered today. The ekg ordered today demonstrates sinus tachycardia.  L axis deviation.     Recent Labs: No results found for requested labs within last 365 days.    Lipid Panel No results found for: CHOL, TRIG, HDL, CHOLHDL, VLDL, LDLCALC, LDLDIRECT     Wt Readings from Last 3 Encounters:  07/20/15 69.536 kg (153 lb 4.8 oz)  10/06/11 65.942 kg (145 lb 6 oz)  09/12/11 65.772 kg (145 lb)    CT angio neck 06/18/15:   IMPRESSION: 1. The right vertebral artery appears occluded just beyond its origin at the thoracic inlet. Highly diminutive right vertebral artery then is reconstituted at the skullbase, probably in a retrograde fashion from the left side. 2. Dominant left vertebral artery with intermittent tortuosity, no stenosis. 3. Moderate to severe bilateral cervical ICA tortuosity at the C1-C2 level. No carotid atherosclerosis or stenosis in the neck.  Other studies Reviewed: Additional studies/ records that were reviewed today include: . Review of the above records demonstrates:  Please see elsewhere in the note.     ASSESSMENT AND PLAN:  # CCS Class III angina: Ms. Grayer has symptoms that are very concerning for angina.  Given her typical symptoms, prior smoking history, hypertension, and strong family history of premature coronary artery disease. She would be considered to have a high pretest probability of coronary artery disease. Therefore we will proceed directly to cardiac catheterization. She has intolerance to aspirin, as it upsets her stomach. Given her known vertebral and carotid artery disease, I feel strongly that she should be on an antiplatelet agent. She also needs aggressive risk factor modification to prevent future heart attacks and strokes. - Start plavix 75mg  daily - Start metoprolol as prescribed - Start atorvastatin 80mg   - Coronary angiography (Risks and benefits of cardiac catheterization have been discussed with the patient.  The patient understands that risks included but are not limited to stroke (1 in 1000), death (1 in 56), kidney failure [usually temporary] (1 in 500), bleeding (1 in 200), allergic reaction [possibly serious] (1 in 200). The patient understands and agrees to proceed.) - Pre-cath labs  (CBC, CMP, coags).  # Hypertension: BP well-controlled.  She has not started taking metoprolol. I think that she should be on a beta blocker given her peripheral vascular disease and likely coronary artery disease. We have recommended that she go ahead and start taking metoprolol and will stop amlodipine. She will continue taking lisinopril as well.   # Hyperlipidemia: Ms. Burggraf reports that her cholesterol is above goal with when checked by her primary care physician. We do not have these numbers available today. Given her risk factors and known peripheral vascular disease, we will start her on atorvastatin as above. She will need repeat lipids and LFTs checked in 6 weeks.  # Peripheral arterial disease: Ms. 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 We have referred her to Dr. Quay Burow for potential intervention for her peripheral vascular disease if indicated. - Start plavix and statin as above  - referral to Dr. Gwenlyn Found for management of carotid and vertebral artery disease  # Sinus tachycardia:  Ms. Kerwood has sinus tachycardia and reports that this is been a chronic issue for her. She denies any anxiety, fevers, chills, cough, or dysuria. In addition to starting metoprolol as above, we will check a d-dimer to rule out pulmonary embolism.  Current medicines are reviewed at length with the patient today.  The patient has concerns regarding medicines.  The following changes have been made:  Start metoprolol. Stop amlodipine.  Start plavix and atorvastatin.   Labs/ tests ordered today include:   Orders Placed This Encounter  Procedures  . DG Chest 2 View  . CBC  . Comprehensive metabolic panel  . APTT  . Protime-INR  . D-Dimer, Quantitative  . EKG 12-Lead  . LEFT HEART CATHETERIZATION WITH CORONARY ANGIOGRAM     Disposition:   FU with Tiffany C. Oval Linsey, MD after  cath.   Signed, Sharol Harness, MD  07/20/2015 2:34 PM    [place]

## 2015-08-02 NOTE — Interval H&P Note (Signed)
Cath Lab Visit (complete for each Cath Lab visit)  Clinical Evaluation Leading to the Procedure:   ACS: No.  Non-ACS:    Anginal Classification: CCS III  Anti-ischemic medical therapy: Maximal Therapy (2 or more classes of medications)  Non-Invasive Test Results: No non-invasive testing performed  Prior CABG: No previous CABG      History and Physical Interval Note:  08/02/2015 2:44 PM  Jamie Madden  has presented today for surgery, with the diagnosis of abnormal stress test  The various methods of treatment have been discussed with the patient and family. After consideration of risks, benefits and other options for treatment, the patient has consented to  Procedure(s): Left Heart Cath and Coronary Angiography (N/A) as a surgical intervention .  The patient's history has been reviewed, patient examined, no change in status, stable for surgery.  I have reviewed the patient's chart and labs.  Questions were answered to the patient's satisfaction.     Quay Burow

## 2015-08-02 NOTE — Progress Notes (Signed)
Jamie Madden was evaluated in the holding area prior to her heart catheterization.  She reports a mildly pruritic rash throughout her body that started yesterday.  She denies any new soaps, clothes, detergents or foods.  She denies any shortness of breath, scratchy throat or difficulty breathing.  She was started on plavix, metoprolol and atorvastatin last week.  Of these, I'm most suspicious of Plavix as the cause.  We will give her Solu-medrol 60mg  IV now and base the decision on her anti-platelet regimen after her cath with Dr. Gwenlyn Found this afternoon.  She has known carotid artery disease and GI upset with aspirin.  Jamie Hatchel C. Oval Linsey, MD 08/02/2015 1:35 PM

## 2015-08-03 ENCOUNTER — Ambulatory Visit: Payer: Medicaid Other | Admitting: Cardiovascular Disease

## 2015-08-03 ENCOUNTER — Telehealth: Payer: Self-pay | Admitting: *Deleted

## 2015-08-03 ENCOUNTER — Encounter (HOSPITAL_COMMUNITY): Payer: Self-pay | Admitting: Cardiovascular Disease

## 2015-08-03 NOTE — Telephone Encounter (Signed)
LEFT MESSAGE TO CALL BACK-REGARDS TO APPOINTMENT TO SEE DR BERRY - CAROTID DISEASE. RN SPOKE TO DR Gwenlyn Found, DR BERRY REVIEW CTA. PER DR BERRY , SCHEDULE CAROTID  THEN FOLLOW UP APPOINTMENT TO DISCUSS

## 2015-08-03 NOTE — Telephone Encounter (Signed)
OPEN ERROR

## 2015-08-09 ENCOUNTER — Ambulatory Visit (INDEPENDENT_AMBULATORY_CARE_PROVIDER_SITE_OTHER): Payer: Medicaid Other | Admitting: Cardiovascular Disease

## 2015-08-09 ENCOUNTER — Encounter: Payer: Self-pay | Admitting: Cardiovascular Disease

## 2015-08-09 VITALS — BP 126/94 | HR 62 | Ht 64.0 in | Wt 152.1 lb

## 2015-08-09 DIAGNOSIS — R0602 Shortness of breath: Secondary | ICD-10-CM

## 2015-08-09 DIAGNOSIS — I6501 Occlusion and stenosis of right vertebral artery: Secondary | ICD-10-CM | POA: Diagnosis not present

## 2015-08-09 DIAGNOSIS — R609 Edema, unspecified: Secondary | ICD-10-CM

## 2015-08-09 DIAGNOSIS — R000 Tachycardia, unspecified: Secondary | ICD-10-CM

## 2015-08-09 DIAGNOSIS — I6509 Occlusion and stenosis of unspecified vertebral artery: Secondary | ICD-10-CM

## 2015-08-09 DIAGNOSIS — Z87891 Personal history of nicotine dependence: Secondary | ICD-10-CM

## 2015-08-09 DIAGNOSIS — R600 Localized edema: Secondary | ICD-10-CM

## 2015-08-09 DIAGNOSIS — R079 Chest pain, unspecified: Secondary | ICD-10-CM

## 2015-08-09 HISTORY — DX: Occlusion and stenosis of unspecified vertebral artery: I65.09

## 2015-08-09 LAB — T4, FREE: Free T4: 1.21 ng/dL (ref 0.80–1.80)

## 2015-08-09 LAB — TSH: TSH: 1.447 u[IU]/mL (ref 0.350–4.500)

## 2015-08-09 MED ORDER — PRASUGREL HCL 10 MG PO TABS: 10.0000 mg | ORAL_TABLET | Freq: Every day | ORAL | Status: DC

## 2015-08-09 MED ORDER — LISINOPRIL 20 MG PO TABS: 20.0000 mg | ORAL_TABLET | Freq: Every day | ORAL | Status: DC

## 2015-08-09 NOTE — Patient Instructions (Signed)
LABS - TSH,T4 FREE   TAKE OMEPRAZOLE DAILY FOR 14 DAYS  CHANGE TO LISINOPRIL TO 20 MG FROM 10  MG. EFFIENT 10 MG ONE TABLET DAILY.   Your physician has requested that you have an echocardiogram. Echocardiography is a painless test that uses sound waves to create images of your heart. It provides your doctor with information about the size and shape of your heart and how well your heart's chambers and valves are working. This procedure takes approximately one hour. There are no restrictions for this procedure.  Non-Cardiac CT scanning, (CAT scanning), is a noninvasive, special x-ray that produces cross-sectional images of the body using x-rays and a computer. CT scans help physicians diagnose and treat medical conditions. For some CT exams, a contrast material is used to enhance visibility in the area of the body being studied. CT scans provide greater clarity and reveal more details than regular x-ray exams.   Your physician has requested that you have a carotid duplex. This test is an ultrasound of the carotid arteries in your neck. It looks at blood flow through these arteries that supply the brain with blood. Allow one hour for this exam. There are no restrictions or special instructions.   You have been referred to Dr Gwenlyn Found - FOLLOW UP CAROTID DOPPLER-   Your physician wants you to follow-up in [place]. You will receive a reminder letter in the mail two months in advance. If you don't receive a letter, please call our office to schedule the follow-up appointment.

## 2015-08-09 NOTE — Progress Notes (Signed)
Cardiology Office Note   Date:  08/09/2015   ID:  [name], DOB 08-May-1961, MRN [id number]  PCP:  [facility]  Cardiologist:   Jamie Harness, MD   Chief Complaint  Patient presents with  . Follow-up    post hosp//pt states chest pain, feels more like tightness, comes and goes  . Shortness of Breath    on exertion  . Dizziness    sometimes      History of Present Illness: Jamie Madden is a 54 y.o. female with R vertebral artery occlusion, a family history of premature coronary artery disease, and prior tobacco abuse, who presents for follow up on chest pain.   Jamie Madden was seen in clinic on September 23,  At which time  She was referred for coroary angiogaphy due to her reprt of clssic angina sympoms,known carotid artery disease , and family history of premate coronay artery disease. She subsequently underwent cardiac catheterization on 10/6 which revealed  No coonary artery disease.   At her clinic appointment she was started on metoprolol , Plavix , and atorvastatn. On the day of her cath  She was noted to have a diffuse, mildly pruritic rash This was thought to be due to Plavix so it was stopped.  Since being seen in the hospital Jamie Madden has been doing fair. She continues to have occasional episodes of chest pain that occur even at rest. She wonders if this may be due to stress, she still was some stressful situations with her daughter. She also notes shortness of breath and feels like she gets tired quickly with exertion. She sometimes has to stop while cleaning her house. She also notes occasional lower extremity edema. She denies orthopnea or PND. She sleeps on one pillow.  She also repots pain in the R side of her neck that radiates down the R arm.   Past Medical History  Diagnosis Date  . Breast mass, right   . Migraines   . 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    Past Surgical History  Procedure  Laterality Date  . Tubal ligation  11/09/1998  . Breast mass excision  08/20/2011    right  . Breast biopsy  09/17/2011    Procedure: BREAST BIOPSY;  Surgeon: Jamie Bowie, MD;  Location: [facility];  Service: General;  Laterality: Right;  re excision right breast mass   . Cardiac catheterization N/A 08/02/2015    Procedure: Left Heart Cath and Coronary Angiography;  Surgeon: Jamie Harp, MD;  Location: [facility];  Service: Cardiovascular;  Laterality: N/A;     Current Outpatient Prescriptions  Medication Sig Dispense Refill  . atorvastatin (LIPITOR) 80 MG tablet Take 1 tablet (80 mg total) by mouth daily. 90 tablet 3  . cyclobenzaprine (FLEXERIL) 10 MG tablet Take 10 mg by mouth 3 (three) times daily as needed for muscle spasms.    Marland Kitchen gabapentin (NEURONTIN) 300 MG capsule Take 300 mg by mouth as needed.  5  . HYDROcodone-acetaminophen (NORCO) 10-325 MG per tablet Take 1 tablet by mouth every 6 (six) hours as needed (pain). for pain  0  . metoprolol tartrate (LOPRESSOR) 25 MG tablet Take 25 mg by mouth 2 (two) times daily.  5  . Multiple Vitamins-Minerals (HAIR/SKIN/NAILS PO) Take 1 tablet by mouth 2 (two) times daily.    Marland Kitchen omeprazole (PRILOSEC) 20 MG capsule Take 1 capsule by mouth  as needed.  5  . lisinopril (PRINIVIL,ZESTRIL) 20 MG tablet Take 1 tablet (20 mg total) by mouth daily. 90 tablet 3  . prasugrel (EFFIENT) 10 MG TABS tablet Take 1 tablet (10 mg total) by mouth daily. 90 tablet 3   No current facility-administered medications for this visit.    Allergies:   Aspirin    Social History:  The patient  reports that she quit smoking about 2 years ago. Her smoking use included Cigarettes. She has a 17 pack-year smoking history. She has never used smokeless tobacco. She reports that she uses illicit drugs (Hydrocodone) about twice per week. She reports that she does not drink alcohol.   Family History:  The patient's family history includes Cancer in  her maternal aunt; Heart disease (age of onset: 35) in her father.  Siblings with CAD prior to age 77.  ROS:  Please see the history of present illness.   Otherwise, review of systems are positive for leg pain at night.   All other systems are reviewed and negative.    PHYSICAL EXAM: VS:  BP 126/94 mmHg  Pulse 62  Ht 5\' 4"  (1.626 m)  Wt 68.992 kg (152 lb 1.6 oz)  BMI 26.10 kg/m2  LMP 07/21/2015 , BMI Body mass index is 26.1 kg/(m^2). GENERAL:  Well appearing HEENT:  Pupils equal round and reactive, fundi not visualized, oral mucosa unremarkable NECK:  No jugular venous distention, waveform within normal limits, carotid upstroke brisk and symmetric, no bruits, no thyromegaly LYMPHATICS:  No cervical adenopathy LUNGS:  Clear to auscultation bilaterally HEART:  RRR.  PMI not displaced or sustained,S1 and S2 within normal limits, no S3, no S4, no clicks, no rubs, no murmurs ABD:  Flat, positive bowel sounds normal in frequency in pitch, no bruits, no rebound, no guarding, no midline pulsatile mass, no hepatomegaly, no splenomegaly EXT:  2 plus pulses throughout, no edema, no cyanosis no clubbing SKIN:  No rashes no nodules NEURO:  Cranial nerves II through XII grossly intact, motor grossly intact throughout PSYCH:  Cognitively intact, oriented to person place and time   EKG:  EKG is ordered today. The ekg ordered today demonstrates sinus rhythm rate 62 bpm.  L axis deviation.     Recent Labs: 07/20/2015: ALT 18; BUN 8; Creat 0.70; Hemoglobin 14.2; Platelets 267; Potassium 4.1; Sodium 139    Lipid Panel No results found for: CHOL, TRIG, HDL, CHOLHDL, VLDL, LDLCALC, LDLDIRECT    Wt Readings from Last 3 Encounters:  08/09/15 68.992 kg (152 lb 1.6 oz)  08/02/15 69.854 kg (154 lb)  07/20/15 69.536 kg (153 lb 4.8 oz)    CT angio neck 06/18/15:   IMPRESSION: 1. The right vertebral artery appears occluded just beyond its origin at the thoracic inlet. Highly diminutive right  vertebral artery then is reconstituted at the skullbase, probably in a retrograde fashion from the left side. 2. Dominant left vertebral artery with intermittent tortuosity, no stenosis. 3. Moderate to severe bilateral cervical ICA tortuosity at the C1-C2 level. No carotid atherosclerosis or stenosis in the neck.  Other studies Reviewed: Additional studies/ records that were reviewed today include: . Review of the above records demonstrates:  Please see elsewhere in the note.     ASSESSMENT AND PLAN:  # Non-cardiac chest pain: cardiac catheterization revealed no coronary artery disease. Other potential etiologies include stress, thyroid dysfunction, and gastroesophageal reflux disease. - Omeprazole 20mg  daily x 2 weeks - Check TSH, fT4  # Hypertension: BP slightly elevated today, as  it has been at home recently.   - Increase lisinopril to 20mg  daily - Continue metoprolol 25mg  bid  # Vertebral artery occlusion: Noted on CT-A of the neck.  She is scheduled to follow up with Dr. Gwenlyn Found regarding peripheral vascular disease. - Start Prasugrel 10 mg daily due to aspirin and plavix intolerance - Continue atorvastatin  # Shortness of breath: Concerning for possible congestive heart failure, especially given her report of LE edema.  She is also concerned that this could be due to lung cancer as she has a over 30 years of smoking history. - echo - CT chest   Current medicines are reviewed at length with the patient today.  The patient has no concerns regarding medicines.  The following changes have been made: Increase lisinopril and start prasugrel 10 mg daily.  Take omeprazole daily for 2 weeks.  Labs/ tests ordered today include:   Orders Placed This Encounter  Procedures  . CT Chest W Contrast  . T4, free  . TSH  . EKG 12-Lead  . ECHOCARDIOGRAM COMPLETE     Disposition:   FU with Jamie Magallanes C. Oval Linsey, MD in 6 months.   Signed, Jamie Harness, MD  08/09/2015 10:06 AM     [place]

## 2015-08-10 ENCOUNTER — Ambulatory Visit: Payer: Medicaid Other | Admitting: Cardiovascular Disease

## 2015-08-10 NOTE — Telephone Encounter (Signed)
Spoke to patient. Result given . Verbalized understanding  

## 2015-08-10 NOTE — Telephone Encounter (Signed)
-----   Message from Skeet Latch, MD sent at 08/09/2015 11:06 PM EDT ----- Normal thyroid function.

## 2015-08-14 ENCOUNTER — Ambulatory Visit (HOSPITAL_COMMUNITY): Payer: Medicaid Other | Attending: Internal Medicine

## 2015-08-14 ENCOUNTER — Other Ambulatory Visit: Payer: Self-pay

## 2015-08-14 ENCOUNTER — Ambulatory Visit (INDEPENDENT_AMBULATORY_CARE_PROVIDER_SITE_OTHER)
Admission: RE | Admit: 2015-08-14 | Discharge: 2015-08-14 | Disposition: A | Discharge status: Home / Self Care | Payer: Medicaid Other | Source: Ambulatory Visit | Attending: Cardiovascular Disease | Admitting: Cardiovascular Disease

## 2015-08-14 DIAGNOSIS — I071 Rheumatic tricuspid insufficiency: Secondary | ICD-10-CM | POA: Diagnosis not present

## 2015-08-14 DIAGNOSIS — I6501 Occlusion and stenosis of right vertebral artery: Secondary | ICD-10-CM | POA: Diagnosis not present

## 2015-08-14 DIAGNOSIS — I10 Essential (primary) hypertension: Secondary | ICD-10-CM | POA: Diagnosis not present

## 2015-08-14 DIAGNOSIS — R079 Chest pain, unspecified: Secondary | ICD-10-CM

## 2015-08-14 DIAGNOSIS — R609 Edema, unspecified: Secondary | ICD-10-CM

## 2015-08-14 DIAGNOSIS — Z87891 Personal history of nicotine dependence: Secondary | ICD-10-CM | POA: Diagnosis not present

## 2015-08-14 DIAGNOSIS — R0602 Shortness of breath: Secondary | ICD-10-CM | POA: Diagnosis not present

## 2015-08-14 DIAGNOSIS — I340 Nonrheumatic mitral (valve) insufficiency: Secondary | ICD-10-CM | POA: Insufficient documentation

## 2015-08-14 DIAGNOSIS — R600 Localized edema: Secondary | ICD-10-CM

## 2015-08-14 MED ORDER — IOHEXOL 300 MG/ML  SOLN
80.0000 mL | Freq: Once | INTRAMUSCULAR | Status: AC | PRN
  Administered 2015-08-14: 80 mL via INTRAVENOUS

## 2015-08-15 ENCOUNTER — Other Ambulatory Visit: Payer: Self-pay | Admitting: Cardiovascular Disease

## 2015-08-15 ENCOUNTER — Encounter: Payer: Self-pay | Admitting: Cardiovascular Disease

## 2015-08-15 ENCOUNTER — Telehealth: Payer: Self-pay | Admitting: *Deleted

## 2015-08-15 ENCOUNTER — Ambulatory Visit (HOSPITAL_COMMUNITY)
Admission: RE | Admit: 2015-08-15 | Discharge: 2015-08-15 | Disposition: A | Discharge status: Home / Self Care | Payer: Medicaid Other | Source: Ambulatory Visit | Attending: Cardiovascular Disease | Admitting: Cardiovascular Disease

## 2015-08-15 DIAGNOSIS — I6501 Occlusion and stenosis of right vertebral artery: Secondary | ICD-10-CM | POA: Diagnosis present

## 2015-08-15 DIAGNOSIS — R918 Other nonspecific abnormal finding of lung field: Secondary | ICD-10-CM

## 2015-08-15 DIAGNOSIS — R609 Edema, unspecified: Secondary | ICD-10-CM | POA: Insufficient documentation

## 2015-08-15 DIAGNOSIS — R600 Localized edema: Secondary | ICD-10-CM

## 2015-08-15 DIAGNOSIS — R0602 Shortness of breath: Secondary | ICD-10-CM | POA: Diagnosis not present

## 2015-08-15 DIAGNOSIS — R911 Solitary pulmonary nodule: Secondary | ICD-10-CM

## 2015-08-15 HISTORY — DX: Solitary pulmonary nodule: R91.1

## 2015-08-15 NOTE — Telephone Encounter (Signed)
Left message to call back  

## 2015-08-15 NOTE — Telephone Encounter (Signed)
-----   Message from Skeet Latch, MD sent at 08/15/2015  1:55 PM EDT ----- CT showed two small nodules in the left lower lobe of the lung.  Given that they are so small, she will need a repeat CT scan in 1 year to make sure that they are not getting bigger.

## 2015-08-15 NOTE — Telephone Encounter (Signed)
-----   Message from Skeet Latch, MD sent at 08/15/2015  1:59 PM EDT ----- Normal echo.

## 2015-08-15 NOTE — Telephone Encounter (Signed)
-----   Message from Skeet Latch, MD sent at 08/15/2015  1:51 PM EDT ----- R vertebral artery is occluded but the other 3 main vessels in the neck are normal.  Keep appointment with Dr. Gwenlyn Found.

## 2015-08-16 NOTE — Telephone Encounter (Signed)
LEFT MESSAGE TO CALL BACK WITH FAMILY MEMBER

## 2015-08-17 NOTE — Telephone Encounter (Signed)
Spoke to patient. Result given . Verbalized understanding Order ct of chest for 12 months

## 2015-09-12 ENCOUNTER — Telehealth: Payer: Self-pay | Admitting: Cardiovascular Disease

## 2015-09-12 NOTE — Telephone Encounter (Signed)
SPOKE TO PATIENT  She states she notice a lot of bruising since being on EFFIENT. She wanted to know if that was normal RN reassured patient, bruising is common. Just be careful . If bruising encompass a large area contact office. Patient verbalized undrstanding.

## 2015-09-12 NOTE — Telephone Encounter (Signed)
Please call, pt is having side effects from Effient.

## 2015-09-26 ENCOUNTER — Encounter: Payer: Self-pay | Admitting: Cardiovascular Disease

## 2015-09-26 ENCOUNTER — Ambulatory Visit (INDEPENDENT_AMBULATORY_CARE_PROVIDER_SITE_OTHER): Payer: Medicaid Other | Admitting: Cardiovascular Disease

## 2015-09-26 VITALS — BP 126/88 | HR 79 | Ht 64.0 in | Wt 157.0 lb

## 2015-09-26 DIAGNOSIS — I6501 Occlusion and stenosis of right vertebral artery: Secondary | ICD-10-CM

## 2015-09-26 DIAGNOSIS — E785 Hyperlipidemia, unspecified: Secondary | ICD-10-CM | POA: Diagnosis not present

## 2015-09-26 DIAGNOSIS — I209 Angina pectoris, unspecified: Secondary | ICD-10-CM

## 2015-09-26 DIAGNOSIS — I10 Essential (primary) hypertension: Secondary | ICD-10-CM

## 2015-09-26 NOTE — Patient Instructions (Signed)
Medication Instructions:  Your physician recommends that you continue on your current medications as directed. Please refer to the Current Medication list given to you today.   Labwork: none  Testing/Procedures: none  Follow-Up: Your physician wants you to follow-up in: 12 months with Dr. Pat Kocher will receive a reminder letter in the mail two months in advance. If you don't receive a letter, please call our office to schedule the follow-up appointment.  Follow up with Dr. Gwenlyn Found as needed.    Any Other Special Instructions Will Be Listed Below (If Applicable).     If you need a refill on your cardiac medications before your next appointment, please call your pharmacy.

## 2015-09-26 NOTE — Assessment & Plan Note (Addendum)
History of hyperlipidemia currently not on statin therapy followed by her PCP

## 2015-09-26 NOTE — Progress Notes (Signed)
09/26/2015 Cheryl Flash   04-Feb-1961  [id number]  Primary Physician [facility] Primary Cardiologist: Lorretta Harp MD Renae Gloss   HPI:  Miss Jamie Madden is a pleasant 54 year old mildly overweight Caucasian female referred to me by Dr. Oval Linsey for outpatient  Cardiac catheterization because of ischemic sounding chest pain, dyspnea and positive cardiac risk factors. I performed diagnostic radial cath on her 08/02/15 which was entirely normal. Risk factor profile is notable for discontinued tobacco abuse, treated hypertension, hyperlipidemia and strong family history for heart disease. She does have an incidentally noted occluded right vertebral artery with a widely patent left vertebral artery and patent internal carotid arteries.   Current Outpatient Prescriptions  Medication Sig Dispense Refill  . cyclobenzaprine (FLEXERIL) 10 MG tablet Take 10 mg by mouth 3 (three) times daily as needed for muscle spasms.    Marland Kitchen gabapentin (NEURONTIN) 300 MG capsule Take 300 mg by mouth as needed.  5  . HYDROcodone-acetaminophen (NORCO) 10-325 MG per tablet Take 1 tablet by mouth every 6 (six) hours as needed (pain). for pain  0  . lisinopril (PRINIVIL,ZESTRIL) 20 MG tablet Take 1 tablet (20 mg total) by mouth daily. 90 tablet 3  . metoprolol tartrate (LOPRESSOR) 25 MG tablet Take 25 mg by mouth 2 (two) times daily.  5  . Multiple Vitamins-Minerals (HAIR/SKIN/NAILS PO) Take 1 tablet by mouth 2 (two) times daily.    Marland Kitchen omeprazole (PRILOSEC) 20 MG capsule Take 1 capsule by mouth as needed.  5  . prasugrel (EFFIENT) 10 MG TABS tablet Take 1 tablet (10 mg total) by mouth daily. 90 tablet 3   No current facility-administered medications for this visit.    Allergies  Allergen Reactions  . Aspirin Nausea Only and Other (See Comments)    Stomach cramps    Social History   Social History  . Marital Status: Divorced    Spouse Name: N/A  . Number of Children: N/A  . Years of  Education: N/A   Occupational History  . Not on file.   Social History Main Topics  . Smoking status: Former Smoker -- 0.50 packs/day for 34 years    Types: Cigarettes    Quit date: 05/20/2013  . Smokeless tobacco: Never Used  . Alcohol Use: No  . Drug Use: 2.00 per week    Special: Hydrocodone  . Sexual Activity: Not on file   Other Topics Concern  . Not on file   Social History Narrative     Review of Systems: General: negative for chills, fever, night sweats or weight changes.  Cardiovascular: negative for chest pain, dyspnea on exertion, edema, orthopnea, palpitations, paroxysmal nocturnal dyspnea or shortness of breath Dermatological: negative for rash Respiratory: negative for cough or wheezing Urologic: negative for hematuria Abdominal: negative for nausea, vomiting, diarrhea, bright red blood per rectum, melena, or hematemesis Neurologic: negative for visual changes, syncope, or dizziness All other systems reviewed and are otherwise negative except as noted above.    Blood pressure 126/88, pulse 79, height 5\' 4"  (1.626 m), weight 157 lb (71.215 kg).  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 She has not occluded right vertebral, with a widely patent left for tubal artery. There are no posterior circulation symptoms. Her internal carotid arteries widely patent. No further evaluation is required at this time  Ischemic chest pain ([place]) History of ischemic  sounding chest pain and shortness of breath on exertion in the setting of multiple cardiac risk factors including discontinued tobacco abuse, treated hypertension, hyperlipidemia and strong family history for heart disease. Based on this she was referred to me for outpatient diagnostic coronary arteriography which I performed radially tends/6/16 revealing completely normal coronary arteries and normal LV function. This suggested that her chest pain was not ischemically mediated and her shortness of breath was likely related to COPD.  Hyperlipidemia History of hyperlipidemia currently not on statin therapy followed by her PCP  Essential hypertension History of hypertension blood pressure measurements at 126/88. She is on lisinopril and metoprolol. Continue current meds at current dosing      Lorretta Harp MD [facility], [facility] 09/26/2015 9:44 AM

## 2015-09-26 NOTE — Assessment & Plan Note (Signed)
History of ischemic sounding chest pain and shortness of breath on exertion in the setting of multiple cardiac risk factors including discontinued tobacco abuse, treated hypertension, hyperlipidemia and strong family history for heart disease. Based on this she was referred to me for outpatient diagnostic coronary arteriography which I performed radially tends/6/16 revealing completely normal coronary arteries and normal LV function. This suggested that her chest pain was not ischemically mediated and her shortness of breath was likely related to COPD.

## 2015-09-26 NOTE — Assessment & Plan Note (Signed)
incidentally noted left vertebral artery occlusion on duplex ultrasound performed in our office 08/15/15. She has not occluded right vertebral, with a widely patent left for tubal artery. There are no posterior circulation symptoms. Her internal carotid arteries widely patent. No further evaluation is required at this time

## 2015-09-26 NOTE — Assessment & Plan Note (Signed)
History of hypertension blood pressure measurements at 126/88. She is on lisinopril and metoprolol. Continue current meds at current dosing

## 2016-02-26 ENCOUNTER — Encounter: Payer: Self-pay | Admitting: Cardiovascular Disease

## 2016-02-26 ENCOUNTER — Ambulatory Visit (INDEPENDENT_AMBULATORY_CARE_PROVIDER_SITE_OTHER): Payer: Medicaid Other | Admitting: Cardiovascular Disease

## 2016-02-26 VITALS — BP 152/86 | HR 79 | Ht 64.0 in | Wt 157.8 lb

## 2016-02-26 DIAGNOSIS — R079 Chest pain, unspecified: Secondary | ICD-10-CM

## 2016-02-26 DIAGNOSIS — I10 Essential (primary) hypertension: Secondary | ICD-10-CM | POA: Diagnosis not present

## 2016-02-26 DIAGNOSIS — I6501 Occlusion and stenosis of right vertebral artery: Secondary | ICD-10-CM

## 2016-02-26 MED ORDER — HYDROCHLOROTHIAZIDE 12.5 MG PO CAPS: 12.5000 mg | ORAL_CAPSULE | Freq: Every day | ORAL | Status: DC

## 2016-02-26 NOTE — Patient Instructions (Addendum)
Medication Instructions:  START HYDROCHLOROTHIAZIDE 12.5 MG DAILY    TAKE YOUR OMEPRAZOLE TWICE A DAY FOR 1 WEEK AND THEN DECREASE BACK TO ONCE A DAY   Labwork: NONE  Testing/Procedures: NONE  Follow-Up: Your physician recommends that you schedule a follow-up appointment in: [place]  Your physician recommends that you schedule a follow-up appointment in: [employer] [facility]  If you need a refill on your cardiac medications before your next appointment, please call your pharmacy.

## 2016-02-26 NOTE — Progress Notes (Signed)
Cardiology Office Note   Date:  02/26/2016   ID:  Jamie Madden, DOB Apr 02, 1961, MRN [id number]  PCP:  [facility]  Cardiologist:   Skeet Latch, MD   Chief Complaint  Patient presents with  . 6 MONTHS    still having chest pain, sob with exertion, and LE swelling. Did stop Effient a month ago secondary to bruising     Patient ID: Jamie Madden is a 55 y.o. female with R vertebral artery occlusion, a family history of premature coronary artery disease, and prior tobacco abuse, who presents for follow up on chest pain.    Interval History 02/26/16: At her last appointment lisinopril was increased due to poorly controlled blood pressure.  She had an echo that revealed normal systolic function and diastolic function.  She was evaluated by Dr. Gwenlyn Found for her occluded right vertebral artery and no further evaluation was recommended.  She continues to have some episodes of chest pain.  She notes a burning sensation in her chest.  It occurs sporadically throughout the day and is worse when she lays down.  It lasts for hours at a time.  She has not noted a sour taste in her mouth but does not epigastric pain after eating certain foods.  She is Prescribed omeprazole but does not take it regularly. She is unsure whether it helps the symptoms.  [name] notes that her blood pressure has been elevated when she sees her primary care physician. He suggested increasing her blood pressure medicine. However, he is not within her network so her insurance does not pay for his medications. She sometimes has headaches and can feel her pressure as sensation in her head. She stopped taking present relative due to frequent bruising and hematoma on her arm after the vacuum cleaner landed on it.  She had a CT scan that showed 2 small nodules in the left lower lung. It was recommended that she have a repeat CT scan in 1 year.  History of Present Illness 08/09/15:  [name] was seen in clinic on September  23,  At which time  She was referred for coroary angiogaphy due to her reprt of clssic angina sympoms,known carotid artery disease , and family history of premate coronay artery disease. She subsequently underwent cardiac catheterization on 10/6 which revealed  No coonary artery disease.   At her clinic appointment she was started on metoprolol , Plavix , and atorvastatn. On the day of her cath  She was noted to have a diffuse, mildly pruritic rash This was thought to be due to Plavix so it was stopped.  Since being seen in the hospital Ms. Koppen has been doing fair. She continues to have occasional episodes of chest pain that occur even at rest. She wonders if this may be due to stress, she still was some stressful situations with her daughter. She also notes shortness of breath and feels like she gets tired quickly with exertion. She sometimes has to stop while cleaning her house. She also notes occasional lower extremity edema. She denies orthopnea or PND. She sleeps on one pillow.  She also repots pain in the R side of her neck that radiates down the R arm.   Past Medical History  Diagnosis Date  . Breast mass, right   . Migraines   . 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  . Pulmonary nodule 08/15/2015    CT chest 08/14/15:  5 x 3 mm subpleural nodule in the left lower lobe (series 3/image 39). Additional 4 x 3 mm subpleural nodule at the left lung base (series 3/image 47).  CT needs to be repeated 07/2016.    Past Surgical History  Procedure Laterality Date  . Tubal ligation  11/09/1998  . Breast mass excision  08/20/2011    right  . Breast biopsy  09/17/2011    Procedure: BREAST BIOPSY;  Surgeon: Harl Bowie, MD;  Location: [facility];  Service: General;  Laterality: Right;  re excision right breast mass   . Cardiac catheterization N/A 08/02/2015    Procedure: Left Heart Cath and Coronary Angiography;  Surgeon: Lorretta Harp, MD;  Location: [place] [facility];  Service: Cardiovascular;  Laterality: N/A;     Current Outpatient Prescriptions  Medication Sig Dispense Refill  . cyclobenzaprine (FLEXERIL) 10 MG tablet Take 10 mg by mouth 3 (three) times daily as needed for muscle spasms.    Marland Kitchen gabapentin (NEURONTIN) 300 MG capsule Take 300 mg by mouth as needed.  5  . HYDROcodone-acetaminophen (NORCO) 10-325 MG per tablet Take 1 tablet by mouth every 6 (six) hours as needed (pain). for pain  0  . lisinopril (PRINIVIL,ZESTRIL) 20 MG tablet Take 1 tablet (20 mg total) by mouth daily. 90 tablet 3  . metoprolol tartrate (LOPRESSOR) 25 MG tablet Take 25 mg by mouth 2 (two) times daily.  5  . Multiple Vitamins-Minerals (HAIR/SKIN/NAILS PO) Take 1 tablet by mouth 2 (two) times daily.    Marland Kitchen omeprazole (PRILOSEC) 20 MG capsule Take 1 capsule by mouth as needed.  5  . hydrochlorothiazide (MICROZIDE) 12.5 MG capsule Take 1 capsule (12.5 mg total) by mouth daily. 90 capsule 3   No current facility-administered medications for this visit.    Allergies:   Aspirin and Ticagrelor    Social History:  The patient  reports that she quit smoking about 2 years ago. Her smoking use included Cigarettes. She has a 17 pack-year smoking history. She has never used smokeless tobacco. She reports that she uses illicit drugs (Hydrocodone) about twice per week. She reports that she does not drink alcohol.   Family History:  The patient's family history includes Cancer in her maternal aunt; Heart disease (age of onset: 52) in her father.  Siblings with CAD prior to age 67.  ROS:  Please see the history of present illness.   Otherwise, review of systems are positive for leg pain at night.   All other systems are reviewed and negative.    PHYSICAL EXAM: VS:  BP 152/86 mmHg  Pulse 79  Ht 5\' 4"  (1.626 m)  Wt 71.578 kg (157 lb 12.8 oz)  BMI 27.07 kg/m2 , BMI Body mass index is 27.07 kg/(m^2). GENERAL:  Well appearing HEENT:  Pupils  equal round and reactive, fundi not visualized, oral mucosa unremarkable NECK:  No jugular venous distention, waveform within normal limits, carotid upstroke brisk and symmetric, no bruits, no thyromegaly LYMPHATICS:  No cervical adenopathy LUNGS:  Clear to auscultation bilaterally HEART:  RRR.  PMI not displaced or sustained,S1 and S2 within normal limits, no S3, no S4, no clicks, no rubs, no murmurs ABD:  Flat, positive bowel sounds normal in frequency in pitch, no bruits, no rebound, no guarding, no midline pulsatile mass, no hepatomegaly, no splenomegaly EXT:  2 plus pulses throughout, no edema, no cyanosis no clubbing SKIN:  No rashes no nodules NEURO:  Cranial nerves II through XII grossly  intact, motor grossly intact throughout PSYCH:  Cognitively intact, oriented to person place and time   EKG:  EKG is ordered today. The ekg ordered today demonstrates sinus rhythm rate 71 bpm.  L axis deviation.    Echo 08/14/15: Study Conclusions  - Left ventricle: The cavity size was normal. Wall thickness was  normal. Systolic function was normal. The estimated ejection  fraction was in the range of 55% to 60%. Wall motion was normal;  there were no regional wall motion abnormalities. Left  ventricular diastolic function parameters were normal. - Mitral valve: Mildly thickened leaflets . There was mild  regurgitation. - Left atrium: The atrium was normal in size. - Right ventricle: The cavity size was normal. Wall thickness was  normal. The moderator band was prominent. Systolic function was  normal. - Tricuspid valve: There was trivial regurgitation. - Pulmonary arteries: PA peak pressure: 23 mm Hg (S). - Inferior vena cava: The vessel was normal in size. The  respirophasic diameter changes were in the normal range (= 50%),  consistent with normal central venous pressure.  Impressions:  - LVEF 55-60%, normal wall thickness, normal wall motion, normal  diastolic function,  mild MV thickening with mild MR, normal  biatrial size, normal RVSP.   Recent Labs: 07/20/2015: ALT 18; BUN 8; Creat 0.70; Hemoglobin 14.2; Platelets 267; Potassium 4.1; Sodium 139 08/09/2015: TSH 1.447    Lipid Panel No results found for: CHOL, TRIG, HDL, CHOLHDL, VLDL, LDLCALC, LDLDIRECT    Wt Readings from Last 3 Encounters:  02/26/16 71.578 kg (157 lb 12.8 oz)  09/26/15 71.215 kg (157 lb)  08/09/15 68.992 kg (152 lb 1.6 oz)    CT angio neck 06/18/15:   IMPRESSION: 1. The right vertebral artery appears occluded just beyond its origin at the thoracic inlet. Highly diminutive right vertebral artery then is reconstituted at the skullbase, probably in a retrograde fashion from the left side. 2. Dominant left vertebral artery with intermittent tortuosity, no stenosis. 3. Moderate to severe bilateral cervical ICA tortuosity at the C1-C2 level. No carotid atherosclerosis or stenosis in the neck.  Other studies Reviewed: Additional studies/ records that were reviewed today include: . Review of the above records demonstrates:  Please see elsewhere in the note.     ASSESSMENT AND PLAN:  # Non-cardiac chest pain: cardiac catheterization revealed no coronary artery disease. I suspect her symptoms are due to GERD. I've asked her to increase her omeprazole to twice daily for one week followed by daily.  # Hypertension: BP is elevated today.  We will start hydrochlorothiazide 12.5 mg, and she also reports some mild lower extremity edema.  She will continue lisinopril and metoprolol.  Given that she does not have obstructive coronary disease we can likely stop metoprolol once her blood pressure is better controlled.  We will check a BMP in 2 weeks.   # Vertebral artery occlusion: Noted on CT-A of the neck.  No further intervention required.    # Incidental Pulmonary Nodules: Repeat CT scan ordered for 1 year.  Patient was congratulated on smoking cessation.  Current medicines are  reviewed at length with the patient today.  The patient has no concerns regarding medicines.  The following changes have been made: Increase omeprazole to bid x1 week followed by daily.  Start HCTZ 12.5mg  daily.    Labs/ tests ordered today include:   Orders Placed This Encounter  Procedures  . EKG 12-Lead     Disposition:   FU with Nathanial Arrighi C. Oval Linsey, MD  in 3 months.  Tommy Medal, PharmD in 2 weeks.  Please check BMP at that time.   Signed, Skeet Latch, MD  02/26/2016 9:45 AM    [facility]

## 2016-03-13 ENCOUNTER — Encounter: Payer: Self-pay | Admitting: Pharmacist Clinician (PhC)/ Clinical Pharmacy Specialist

## 2016-03-13 ENCOUNTER — Ambulatory Visit (INDEPENDENT_AMBULATORY_CARE_PROVIDER_SITE_OTHER): Payer: Medicaid Other | Admitting: Pharmacist

## 2016-03-13 VITALS — BP 122/82

## 2016-03-13 DIAGNOSIS — I10 Essential (primary) hypertension: Secondary | ICD-10-CM | POA: Diagnosis not present

## 2016-03-13 LAB — BASIC METABOLIC PANEL
BUN: 14 mg/dL (ref 7–25)
CALCIUM: 9.5 mg/dL (ref 8.6–10.4)
CO2: 28 mmol/L (ref 20–31)
Chloride: 103 mmol/L (ref 98–110)
Creat: 0.67 mg/dL (ref 0.50–1.05)
GLUCOSE: 95 mg/dL (ref 65–99)
Potassium: 4.7 mmol/L (ref 3.5–5.3)
SODIUM: 142 mmol/L (ref 135–146)

## 2016-03-13 NOTE — Patient Instructions (Signed)
Return for a a follow up appointment with Dr. Oval Linsey in Aug.   Please call the clinic if your blood pressure reading are consistently >140/90.   Your blood pressure today is 124/82   Check your blood pressure at home daily (if able) and keep record of the readings.  No changes in medication today. Please go to the lab to have blood work after our visit.   Bring all of your meds, your BP cuff and your record of home blood pressures to your next appointment.  Exercise as you're able, try to walk approximately 30 minutes per day.  Keep salt intake to a minimum, especially watch canned and prepared boxed foods.  Eat more fresh fruits and vegetables and fewer canned items.  Avoid eating in fast food restaurants.    HOW TO TAKE YOUR BLOOD PRESSURE: . Rest 5 minutes before taking your blood pressure. .  Don't smoke or drink caffeinated beverages for at least 30 minutes before. . Take your blood pressure before (not after) you eat. . Sit comfortably with your back supported and both feet on the floor (don't cross your legs). . Elevate your arm to heart level on a table or a desk. . Use the proper sized cuff. It should fit smoothly and snugly around your bare upper arm. There should be enough room to slip a fingertip under the cuff. The bottom edge of the cuff should be 1 inch above the crease of the elbow. . Ideally, take 3 measurements at one sitting and record the average.

## 2016-03-13 NOTE — Assessment & Plan Note (Signed)
BP is at goal and much improved since starting hydrochlorothiazide. No medication changes today. BMET today after starting new medication. Continue to monitor at home and call clinic if BP >140/90. Follow up with Dr. Oval Linsey in August.

## 2016-03-13 NOTE — Progress Notes (Signed)
Patient ID: Jamie Madden                 DOB: 09-29-1961                      MRN: [id number]     HPI: Jamie Madden is a 55 y.o. female referred by Dr. Oval Linsey to HTN clinic for evaluation. She was recently seen and at that appointment hydrochlorothiazide was added.   She also reports more frequent headache the first week after starting HCTZ, but says these have improved over the last week. She does have a history of migraines and is seeing her primary care doctor in a few weeks about this. She also reported that she is trying apple cider vinegar for weight loss and she has lost 3 pounds since her last visit.   Current HTN meds:  Hydrochlorothiazide 12.5mg  daily - morning Lisinopril 20mg  daily - evening Metoprolol 25mg  twice daily  BP goal: <140/90  Family History: extensive history - Father died from [place] in early 52s. Mother is living with hypertension. One brother passed away from MI at 56. Brothers and sisters all have hypertension and some with cardiac disease.   Social History: She reports that she started smoking around age 21. She quit about 3 years ago and has never used smokeless tobacco. She also reports no alcohol.   Diet: Eats mostly at home and tries to have a vegetable with every meal. She occasionally eats fast food. She admits to using a lot of salt, but says she has cut back quite a bit since finding out she had high blood pressure. She has coffee about 1-2 times a week and rarely has soda or tea.   Exercise: limited due to arthritis. Tries to walk 10-15 minutes 1-2 times a week.   Home BP readings:  She reports that her blood pressure since starting HCTZ have been in the 120s/70s. Prior to that they normally ran around 150/90-100. She reports no measurements >150 in the last week. She did not bring her log or meter with her today.   Wt Readings from Last 3 Encounters:  02/26/16 157 lb 12.8 oz (71.578 kg)  09/26/15 157 lb (71.215 kg)  08/09/15 152 lb 1.6 oz (68.992 kg)     BP Readings from Last 3 Encounters:  02/26/16 152/86  09/26/15 126/88  08/09/15 126/94   Pulse Readings from Last 3 Encounters:  02/26/16 79  09/26/15 79  08/09/15 62    Renal function: CrCl cannot be calculated (Unknown ideal weight.).  Past Medical History  Diagnosis Date  . Breast mass, right   . Migraines   . 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  . Pulmonary nodule 08/15/2015    CT chest 08/14/15:  5 x 3 mm subpleural nodule in the left lower lobe (series 3/image 39). Additional 4 x 3 mm subpleural nodule at the left lung base (series 3/image 47).  CT needs to be repeated 07/2016.    Current Outpatient Prescriptions on File Prior to Visit  Medication Sig Dispense Refill  . cyclobenzaprine (FLEXERIL) 10 MG tablet Take 10 mg by mouth 3 (three) times daily as needed for muscle spasms.    Marland Kitchen gabapentin (NEURONTIN) 300 MG capsule Take 300 mg by mouth as needed.  5  . hydrochlorothiazide (MICROZIDE) 12.5 MG capsule Take 1 capsule (12.5 mg total) by mouth daily. 90 capsule 3  . HYDROcodone-acetaminophen (NORCO) 10-325 MG per  tablet Take 1 tablet by mouth every 6 (six) hours as needed (pain). for pain  0  . lisinopril (PRINIVIL,ZESTRIL) 20 MG tablet Take 1 tablet (20 mg total) by mouth daily. 90 tablet 3  . metoprolol tartrate (LOPRESSOR) 25 MG tablet Take 25 mg by mouth 2 (two) times daily.  5  . Multiple Vitamins-Minerals (HAIR/SKIN/NAILS PO) Take 1 tablet by mouth 2 (two) times daily.    Marland Kitchen omeprazole (PRILOSEC) 20 MG capsule Take 1 capsule by mouth as needed.  5   No current facility-administered medications on file prior to visit.    Allergies  Allergen Reactions  . Aspirin Nausea Only and Other (See Comments)    Stomach cramps  . Ticagrelor Rash     Assessment/Plan: Hypertension: BP is at goal and much improved since starting hydrochlorothiazide. No medication changes today. BMET today after starting new medication.  Continue to monitor at home and call clinic if BP >140/90. Follow up with Dr. Oval Linsey in August.   Thank you, Lelan Pons. Patterson Hammersmith, [facility]  03/13/2016 12:31 PM

## 2016-05-07 ENCOUNTER — Telehealth: Payer: Self-pay | Admitting: Cardiovascular Disease

## 2016-05-07 MED ORDER — CHLORTHALIDONE 25 MG PO TABS: 12.5000 mg | ORAL_TABLET | Freq: Every day | ORAL | Status: AC

## 2016-05-07 NOTE — Telephone Encounter (Signed)
Patient transferred in to triage via refill department with question regarding her medication HCTZ  She states she "feels sick" when taking this med. She says her "body feels weird" when urinating.   She stopped HCTZ last week - her BP has gone up - 127/83, 154/95 (readings provided.   Patient aware that message will be sent to MD/clinical pharmacy staff for review/recommendations

## 2016-05-07 NOTE — Telephone Encounter (Signed)
Returned to call pt. She reports that she feels much better off the HCTZ. When asked to describe her symptoms she reported that her whole body felt numb when she would urinate. Since coming off medication she has been experiencing more leg swelling and her blood pressure has been increasing. She also reports several headaches.   Would favor getting her back on diuretic for the swelling in her legs. Will try chlorthalidone 12.5mg  once daily. She was unwilling to come for a blood pressure check prior to visit with Dr. Oval Linsey.   Have instructed her to call with any issues.

## 2016-06-02 NOTE — Progress Notes (Signed)
Cardiology Office Note   Date:  06/03/2016   ID:  Jamie Madden, DOB 02-23-1961, MRN [id number]  PCP:  [facility]  Cardiologist:   Skeet Latch, MD   No chief complaint on file.   History of Present Illness: Jamie Madden is a 55 y.o. female with R vertebral artery occlusion, a family history of premature coronary artery disease, and prior tobacco abuse, who presents for follow up.  Jamie Madden was seen in clinic 06/2015 at which time she was referred for coronary angiography due to her report of classic angina, known carotid artery disease and family history of premature CAD.   She subsequently underwent cardiac catheterization on 10/6 which revealed no coonary artery disease.  She continued to have episodes of chest pain that she attributed to stress.  She noted exertional dyspnea so she had an echo that revealed LVEF 55-60% with mild MR and normal diastolic function. She was evaluated by Dr. Gwenlyn Found for her occluded right vertebral artery and no further evaluation was recommended.   Jamie Madden has struggled with her blood pressure.  At the last appointment she was started on HCTZ which was switched to chlorthalidone due to side effects.  Since then her BP has been well-controlled.  She notes sharp, right sided chest pain for the last week.  It occurs at rest and there is no associated shortness of breath.  The episodes last for one minute.  She does note improvement in her abdominal pain since starting the PPI.  Jamie Madden reports pain in her right shoulder. She wonders if this is causing radiation into the right side of her chest. She has seen her PCP about this in the past and had x-rays. She was told that she had arthritis. However, the pain is getting worse and her PCPs office closed recently. She is working on trying to find a new PCP.   Past Medical History:  Diagnosis Date  . Breast mass, right   . Migraines   . Pulmonary nodule 08/15/2015   CT chest 08/14/15:  5 x 3 mm  subpleural nodule in the left lower lobe (series 3/image 39). Additional 4 x 3 mm subpleural nodule at the left lung base (series 3/image 47).  CT needs to be repeated 07/2016.  Marland Kitchen Shoulder pain, right 06/03/2016  . Vertebral artery obstruction 08/09/2015   R vertebral artery occluded on CT-A 06/18/15.    Past Surgical History:  Procedure Laterality Date  . BREAST BIOPSY  09/17/2011   Procedure: BREAST BIOPSY;  Surgeon: Harl Bowie, MD;  Location: [facility];  Service: General;  Laterality: Right;  re excision right breast mass   . BREAST MASS EXCISION  08/20/2011   right  . CARDIAC CATHETERIZATION N/A 08/02/2015   Procedure: Left Heart Cath and Coronary Angiography;  Surgeon: Lorretta Harp, MD;  Location: [facility];  Service: Cardiovascular;  Laterality: N/A;  . TUBAL LIGATION  11/09/1998     Current Outpatient Prescriptions  Medication Sig Dispense Refill  . chlorthalidone (HYGROTON) 25 MG tablet Take 0.5 tablets (12.5 mg total) by mouth daily. 30 tablet 3  . cyclobenzaprine (FLEXERIL) 10 MG tablet Take 10 mg by mouth 3 (three) times daily as needed for muscle spasms.    Marland Kitchen gabapentin (NEURONTIN) 300 MG capsule Take 300 mg by mouth as needed.  5  . HYDROcodone-acetaminophen (NORCO) 10-325 MG per tablet Take 1 tablet by mouth every 6 (six) hours as needed (pain). for pain  0  . lisinopril (PRINIVIL,ZESTRIL) 20 MG tablet Take 1 tablet (20 mg total) by mouth daily. 90 tablet 3  . metoprolol tartrate (LOPRESSOR) 25 MG tablet Take 25 mg by mouth 2 (two) times daily.  5  . Multiple Vitamins-Minerals (HAIR/SKIN/NAILS PO) Take 1 tablet by mouth 2 (two) times daily.    Marland Kitchen omeprazole (PRILOSEC) 20 MG capsule Take 1 capsule by mouth as needed. Reported on 03/13/2016  5   No current facility-administered medications for this visit.     Allergies:   Aspirin and Ticagrelor    Social History:  The patient  reports that she quit smoking about 3 years ago. Her smoking use  included Cigarettes. She has a 17.00 pack-year smoking history. She has never used smokeless tobacco. She reports that she uses drugs, including Hydrocodone, about 2 times per week. She reports that she does not drink alcohol.   Family History:  The patient's family history includes Cancer in her maternal aunt; Heart disease (age of onset: 54) in her father.  Siblings with CAD prior to age 36.  ROS:  Please see the history of present illness.   Otherwise, review of systems are positive for leg pain at night.   All other systems are reviewed and negative.    PHYSICAL EXAM: VS:  BP 106/72   Pulse 64   Ht 5\' 4"  (1.626 m)   Wt 155 lb 9.6 oz (70.6 kg)   SpO2 98%   BMI 26.71 kg/m  , BMI Body mass index is 26.71 kg/m. GENERAL:  Well appearing HEENT:  Pupils equal round and reactive, fundi not visualized, oral mucosa unremarkable NECK:  No jugular venous distention, waveform within normal limits, carotid upstroke brisk and symmetric, no bruits, no thyromegaly LYMPHATICS:  No cervical adenopathy LUNGS:  Clear to auscultation bilaterally HEART:  RRR.  PMI not displaced or sustained,S1 and S2 within normal limits, no S3, no S4, no clicks, no rubs, no murmurs ABD:  Flat, positive bowel sounds normal in frequency in pitch, no bruits, no rebound, no guarding, no midline pulsatile mass, no hepatomegaly, no splenomegaly EXT:  2 plus pulses throughout, no edema, no cyanosis no clubbing SKIN:  No rashes no nodules NEURO:  Cranial nerves II through XII grossly intact, motor grossly intact throughout PSYCH:  Cognitively intact, oriented to person place and time   EKG:  EKG is ordered today. The ekg ordered today demonstrates sinus rhythm rate 71 bpm.  L axis deviation.    Echo 08/14/15: Study Conclusions  - Left ventricle: The cavity size was normal. Wall thickness was  normal. Systolic function was normal. The estimated ejection  fraction was in the range of 55% to 60%. Wall motion was  normal;  there were no regional wall motion abnormalities. Left  ventricular diastolic function parameters were normal. - Mitral valve: Mildly thickened leaflets . There was mild  regurgitation. - Left atrium: The atrium was normal in size. - Right ventricle: The cavity size was normal. Wall thickness was  normal. The moderator band was prominent. Systolic function was  normal. - Tricuspid valve: There was trivial regurgitation. - Pulmonary arteries: PA peak pressure: 23 mm Hg (S). - Inferior vena cava: The vessel was normal in size. The  respirophasic diameter changes were in the normal range (= 50%),  consistent with normal central venous pressure.  Impressions:  - LVEF 55-60%, normal wall thickness, normal wall motion, normal  diastolic function, mild MV thickening with mild MR, normal  biatrial size, normal RVSP.  Recent Labs: 07/20/2015: ALT 18; Hemoglobin 14.2; Platelets 267 08/09/2015: TSH 1.447 03/13/2016: BUN 14; Creat 0.67; Potassium 4.7; Sodium 142    Lipid Panel No results found for: CHOL, TRIG, HDL, CHOLHDL, VLDL, LDLCALC, LDLDIRECT    Wt Readings from Last 3 Encounters:  06/03/16 155 lb 9.6 oz (70.6 kg)  02/26/16 157 lb 12.8 oz (71.6 kg)  09/26/15 157 lb (71.2 kg)    CT angio neck 06/18/15:   IMPRESSION: 1. The right vertebral artery appears occluded just beyond its origin at the thoracic inlet. Highly diminutive right vertebral artery then is reconstituted at the skullbase, probably in a retrograde fashion from the left side. 2. Dominant left vertebral artery with intermittent tortuosity, no stenosis. 3. Moderate to severe bilateral cervical ICA tortuosity at the C1-C2 level. No carotid atherosclerosis or stenosis in the neck.  Other studies Reviewed: Additional studies/ records that were reviewed today include: . Review of the above records demonstrates:  Please see elsewhere in the note.     ASSESSMENT AND PLAN:  # Non-cardiac chest  pain: Cardiac catheterization revealed no coronary artery disease. Her pain improved somewhat with omeprazole, but she now has right sided chest pain. This also seems to be noncardiac. I have referred her to orthopedics to evaluate her shoulder.  # Hypertension: BP is well-controlled.  Continue chlorthalidone, lisinopril, and metoprolol.   # Vertebral artery occlusion: Noted on CT-A of the neck.  No further intervention required.    # Incidental Pulmonary Nodules: Repeat CT scan ordered for 07/2016.  # CV Disease prevention: Check lipids and CMP today.  # R shoulder pain: Ms. Wery reports pain in her right shoulder and was told that she had arthritis in the past.  Her PCP's office closed so we will refer her to orthopedics for evaluation.  Current medicines are reviewed at length with the patient today.  The patient has no concerns regarding medicines.  The following changes have been made: None  Labs/ tests ordered today include:   Orders Placed This Encounter  Procedures  . Lipid panel  . Comprehensive Metabolic Panel (CMET)     Disposition:   FU with Javion Holmer C. Oval Linsey, MD in 6 months   Signed, Skeet Latch, MD  06/03/2016 1:20 PM    [place]

## 2016-06-03 ENCOUNTER — Ambulatory Visit (INDEPENDENT_AMBULATORY_CARE_PROVIDER_SITE_OTHER): Payer: Medicaid Other | Admitting: Cardiovascular Disease

## 2016-06-03 ENCOUNTER — Encounter (INDEPENDENT_AMBULATORY_CARE_PROVIDER_SITE_OTHER): Payer: Self-pay

## 2016-06-03 ENCOUNTER — Encounter: Payer: Self-pay | Admitting: Cardiovascular Disease

## 2016-06-03 VITALS — BP 106/72 | HR 64 | Ht 64.0 in | Wt 155.6 lb

## 2016-06-03 DIAGNOSIS — K219 Gastro-esophageal reflux disease without esophagitis: Secondary | ICD-10-CM

## 2016-06-03 DIAGNOSIS — M25511 Pain in right shoulder: Secondary | ICD-10-CM | POA: Diagnosis not present

## 2016-06-03 DIAGNOSIS — E785 Hyperlipidemia, unspecified: Secondary | ICD-10-CM

## 2016-06-03 DIAGNOSIS — I10 Essential (primary) hypertension: Secondary | ICD-10-CM

## 2016-06-03 HISTORY — DX: Pain in right shoulder: M25.511

## 2016-06-03 LAB — COMPREHENSIVE METABOLIC PANEL
ALBUMIN: 4.1 g/dL (ref 3.6–5.1)
ALK PHOS: 155 U/L — AB (ref 33–130)
ALT: 37 U/L — ABNORMAL HIGH (ref 6–29)
AST: 14 U/L (ref 10–35)
BILIRUBIN TOTAL: 0.5 mg/dL (ref 0.2–1.2)
BUN: 13 mg/dL (ref 7–25)
CALCIUM: 9.8 mg/dL (ref 8.6–10.4)
CO2: 30 mmol/L (ref 20–31)
CREATININE: 0.8 mg/dL (ref 0.50–1.05)
Chloride: 98 mmol/L (ref 98–110)
Glucose, Bld: 92 mg/dL (ref 65–99)
Potassium: 4.6 mmol/L (ref 3.5–5.3)
SODIUM: 141 mmol/L (ref 135–146)
TOTAL PROTEIN: 7 g/dL (ref 6.1–8.1)

## 2016-06-03 LAB — LIPID PANEL
CHOLESTEROL: 181 mg/dL (ref 125–200)
HDL: 38 mg/dL — AB (ref >=46)
LDL Cholesterol: 116 mg/dL (ref <130)
Total CHOL/HDL Ratio: 4.8 Ratio (ref <=5.0)
Triglycerides: 136 mg/dL (ref <150)
VLDL: 27 mg/dL (ref <30)

## 2016-06-03 NOTE — Patient Instructions (Addendum)
Medication Instructions:  Your physician recommends that you continue on your current medications as directed. Please refer to the Current Medication list given to you today.  Labwork: LP/CMET AT [facility] ON THE FIRST FLOOR  Testing/Procedures: NONE  Follow-Up: Your physician wants you to follow-up in: [place] will receive a reminder letter in the mail two months in advance. If you don't receive a letter, please call our office to schedule the follow-up appointment  Will arrange for you to see [place] and call you with date and time   If you need a refill on your cardiac medications before your next appointment, please call your pharmacy.

## 2016-06-04 ENCOUNTER — Telehealth: Payer: Self-pay | Admitting: *Deleted

## 2016-06-04 NOTE — Telephone Encounter (Signed)
Patient seen by Dr Oval Linsey for office visit on 06/03/16 Per Dr Oval Linsey refer to Orthopedic for shoulder pain Spoke with [facility] and since patient has Medicaid [place] Access referral must come from PCP  Will try again later

## 2016-06-05 MED ORDER — OMEPRAZOLE 20 MG PO CPDR: 20.0000 mg | DELAYED_RELEASE_CAPSULE | ORAL | 1 refills | Status: DC | PRN

## 2016-06-05 NOTE — Telephone Encounter (Signed)
Spoke with patient and advised patient She requested refill on Prilosec, sent to [facility]

## 2016-07-04 ENCOUNTER — Other Ambulatory Visit: Payer: Self-pay | Admitting: Cardiovascular Disease

## 2016-07-07 NOTE — Telephone Encounter (Signed)
Review for refill. 

## 2016-08-04 ENCOUNTER — Other Ambulatory Visit: Payer: Self-pay | Admitting: Cardiovascular Disease

## 2016-08-04 NOTE — Telephone Encounter (Signed)
Review for refill. 

## 2016-08-15 ENCOUNTER — Ambulatory Visit (INDEPENDENT_AMBULATORY_CARE_PROVIDER_SITE_OTHER)
Admission: RE | Admit: 2016-08-15 | Discharge: 2016-08-15 | Disposition: A | Discharge status: Home / Self Care | Payer: Medicaid Other | Source: Ambulatory Visit | Attending: Cardiovascular Disease | Admitting: Cardiovascular Disease

## 2016-08-15 DIAGNOSIS — R918 Other nonspecific abnormal finding of lung field: Secondary | ICD-10-CM | POA: Diagnosis not present

## 2016-08-15 MED ORDER — IOPAMIDOL (ISOVUE-300) INJECTION 61%
80.0000 mL | Freq: Once | INTRAVENOUS | Status: AC | PRN
  Administered 2016-08-15: 80 mL via INTRAVENOUS

## 2016-08-22 ENCOUNTER — Telehealth: Payer: Self-pay | Admitting: Cardiovascular Disease

## 2016-08-22 NOTE — Telephone Encounter (Signed)
Advised patient

## 2016-08-22 NOTE — Telephone Encounter (Signed)
-----   Message from Skeet Latch, MD sent at 08/21/2016  5:10 PM EDT ----- Pulmonary nodules are stable and have not changed. Repeat in 18 months. If it is unchanged we can stop checking at that time.

## 2016-08-22 NOTE — Telephone Encounter (Signed)
Follow Up:; ° ° °Returning your call. °

## 2016-09-29 IMAGING — CR DG CHEST 2V
2 series · 2 of 2 positions shown · non-contrast
Comparison: None.

CLINICAL DATA: Preoperative respiratory evaluation prior to cardiac
catheterization. Exertional angina.

EXAM:
CHEST  2 VIEW

[w chest pa]
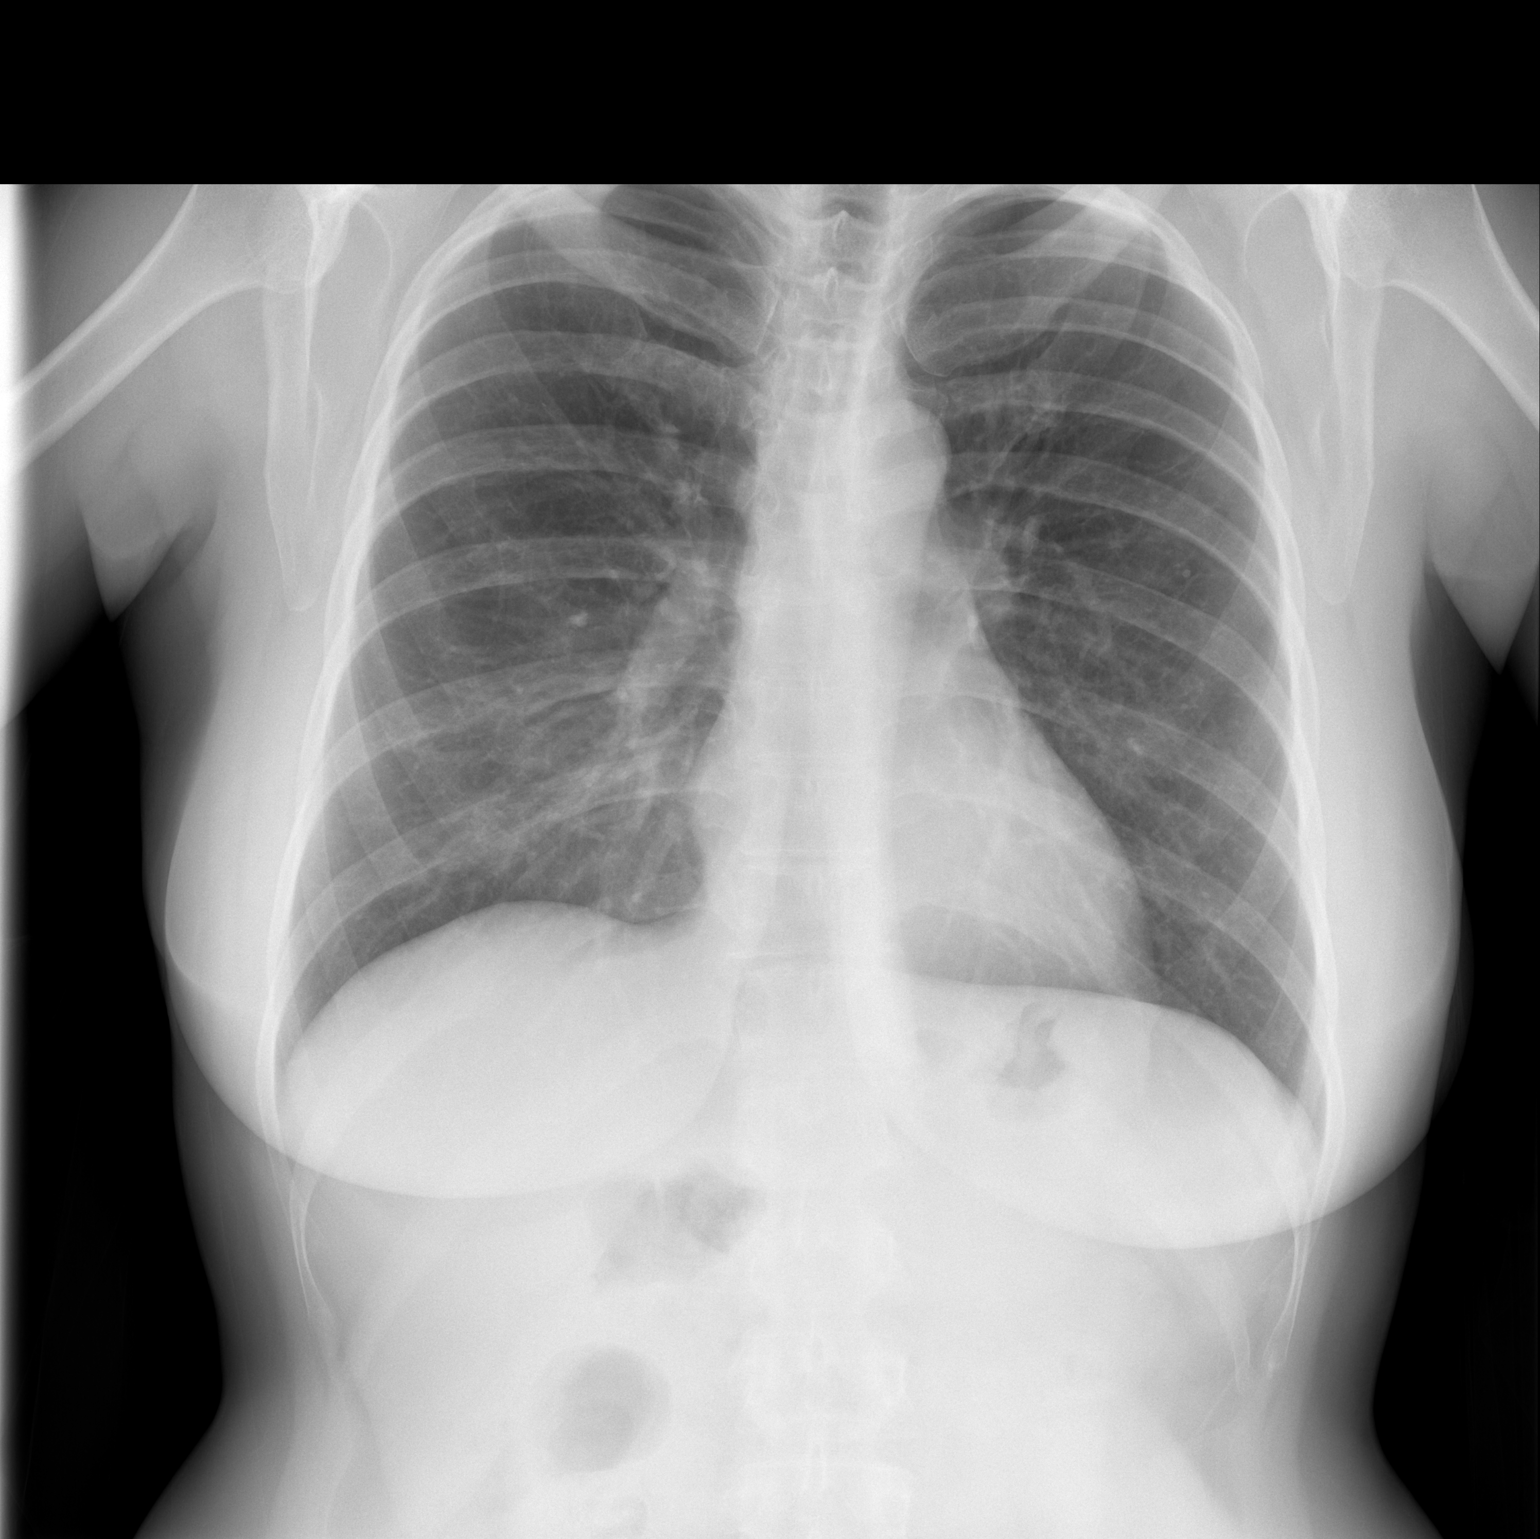

[w chest lat]
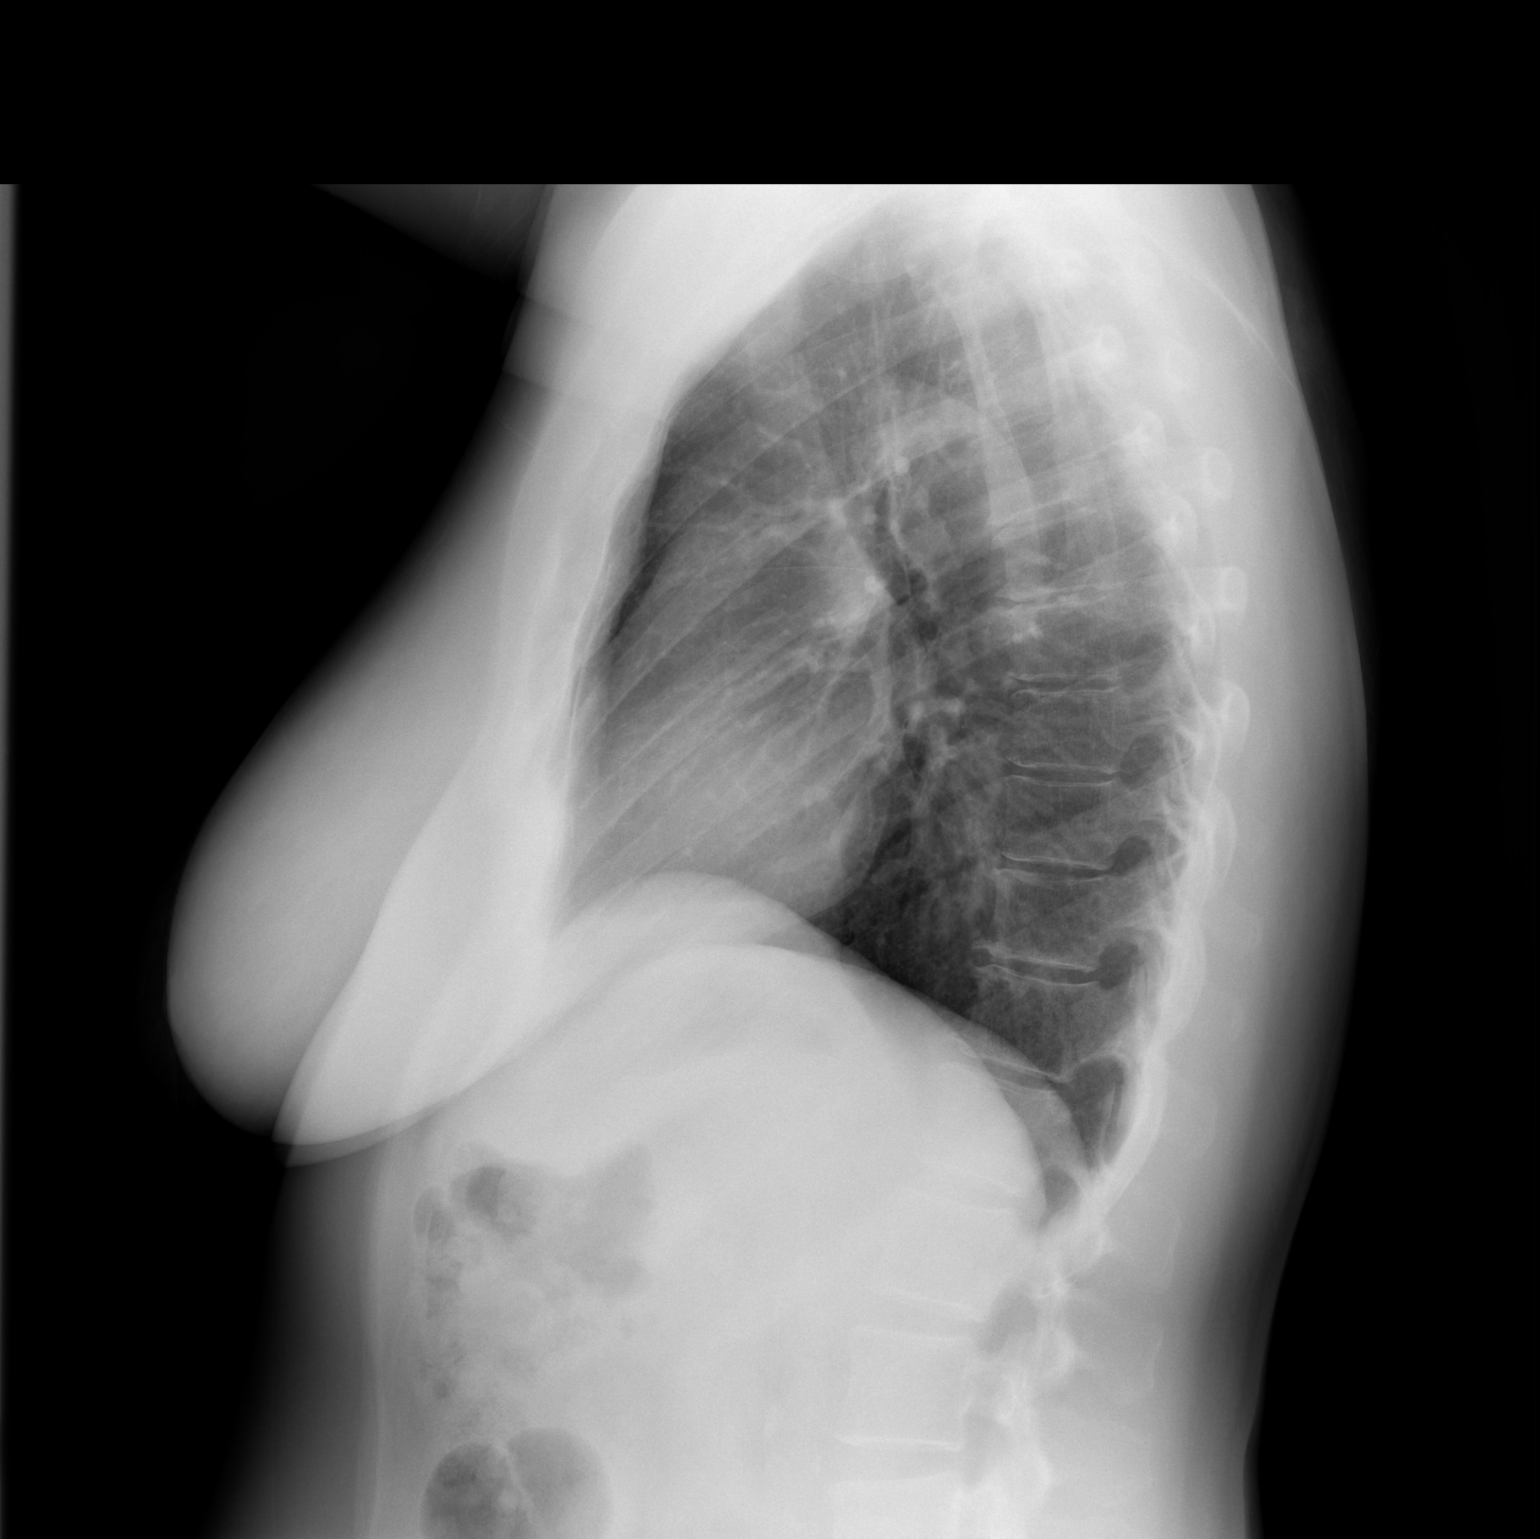

[2 of 2 positions shown; findings below may reference images not displayed]

FINDINGS: Cardiomediastinal silhouette unremarkable. Lungs clear.
Bronchovascular markings normal. Pulmonary vascularity normal. No
visible pleural effusions. No pneumothorax. Mild degenerative disc
disease and spondylosis involving the upper and lower thoracic
spine.
IMPRESSION: No acute cardiopulmonary disease.

## 2016-10-04 IMAGING — DX DG CHEST 2V
2 series · 2 of 2 positions shown · non-contrast
Comparison: Radiographs 07/20/2015.

CLINICAL DATA: Preoperative cardiovascular examination for Coronary
angiography. Initial encounter.

EXAM:
CHEST  2 VIEW

[chest pa]
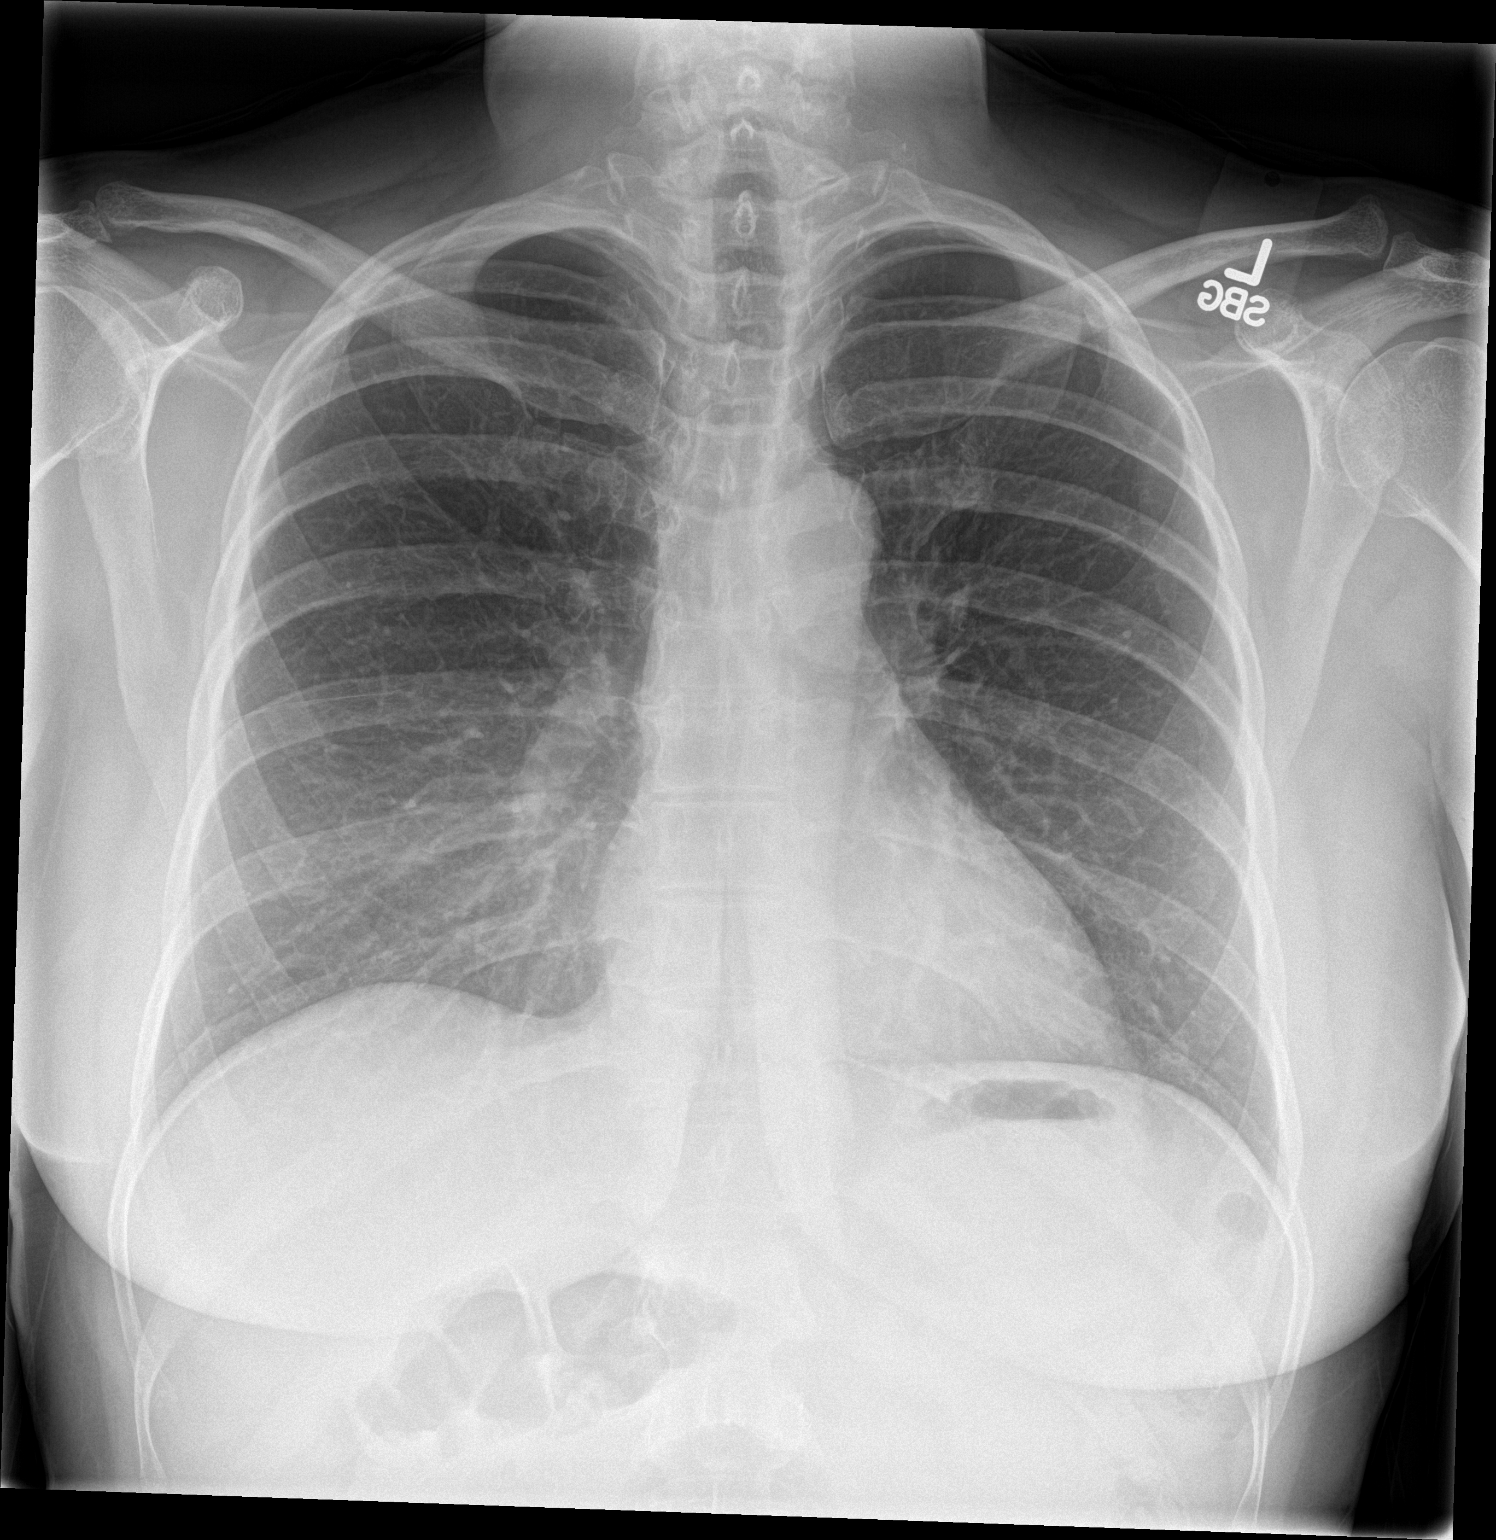

[chest lat]
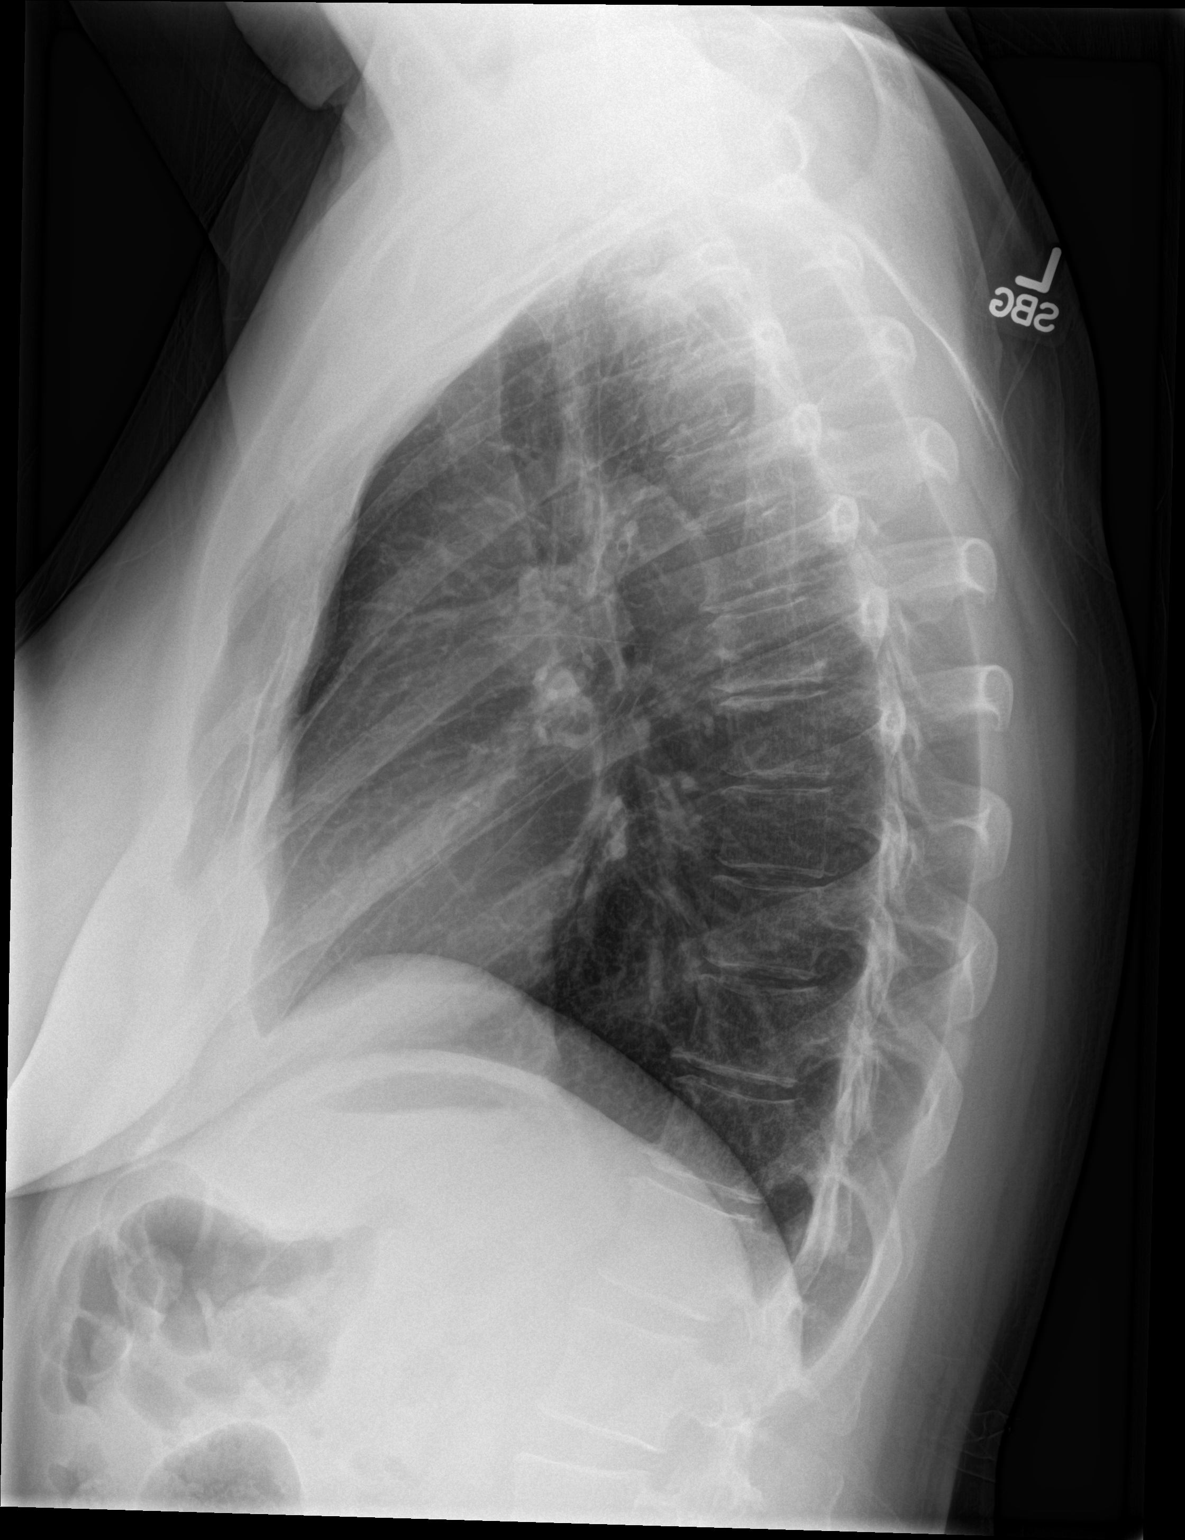

[2 of 2 positions shown; findings below may reference images not displayed]

FINDINGS: The heart size and mediastinal contours are stable. The lungs are
clear. There is no pleural effusion or pneumothorax. The bones
appear unchanged.
IMPRESSION: Stable chest.  No active cardiopulmonary process.

## 2016-10-24 IMAGING — CT CT CHEST W/ CM
2 of 4 series · 15 of 36 positions shown, 18 images · IV contrast (Omnipaque 300)
Comparison: Chest radiographs dated 07/17/2015

CLINICAL DATA: Chest pain, shortness of breath

EXAM:
CT CHEST WITH CONTRAST
TECHNIQUE: Multidetector CT imaging of the chest was performed during
intravenous contrast administration.
CONTRAST:  80mL OMNIPAQUE IOHEXOL 300 MG/ML  [facility]

[Series 2: chest routine with · axial · 0.64mm/px · z∈[-247,-12]mm · 12 of 57 slices shown, 15 images]
[im 5/57  mediastinal]
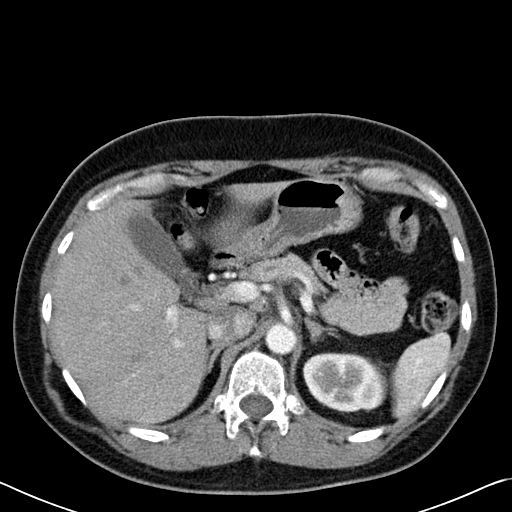
[im 5/57  lung]
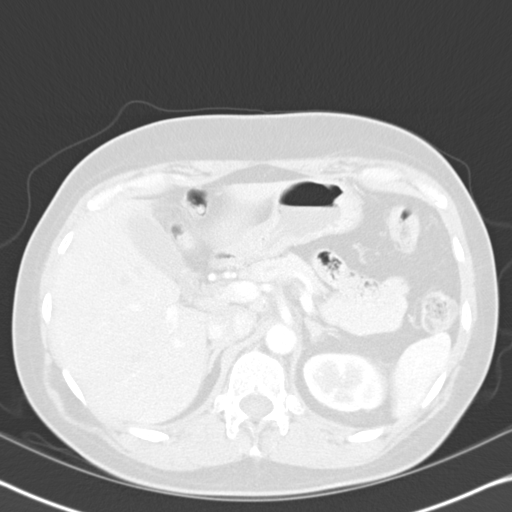
[im 9/57  lung]
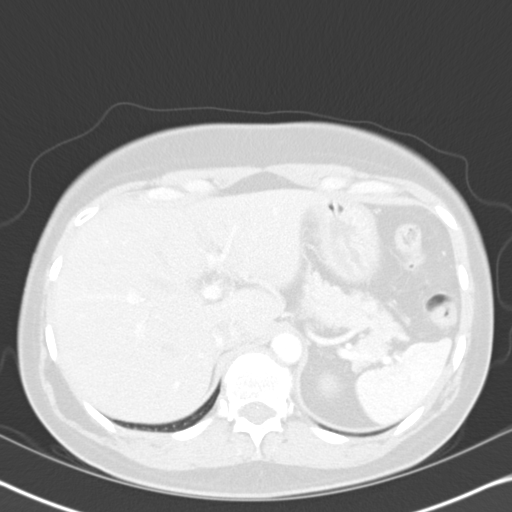
[im 13/57  lung]
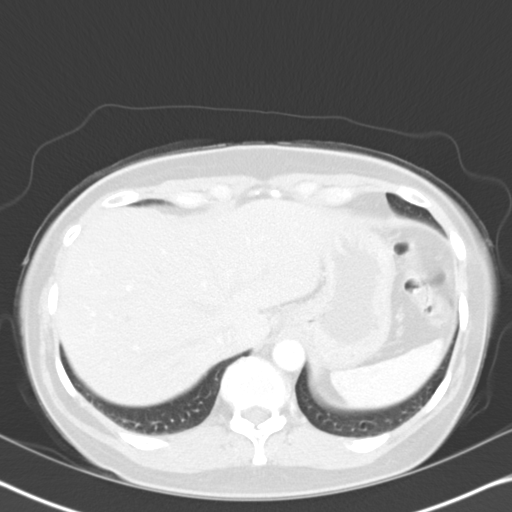
[im 18/57  lung]
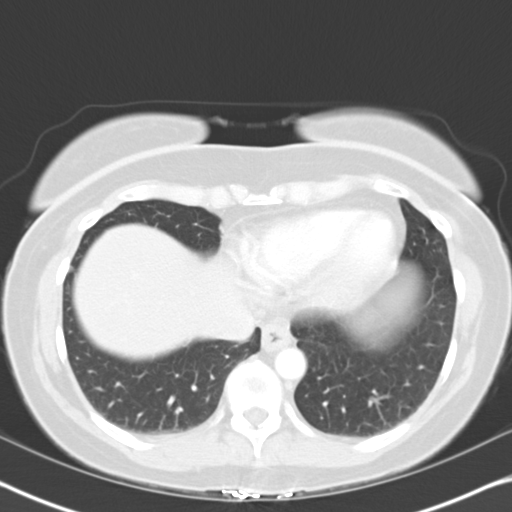
[im 22/57  mediastinal]
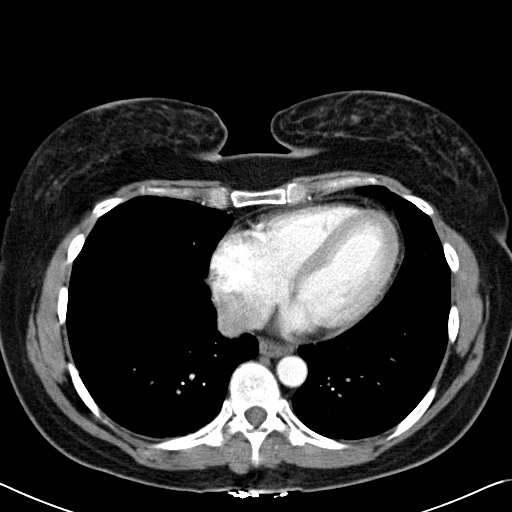
[im 22/57  lung]
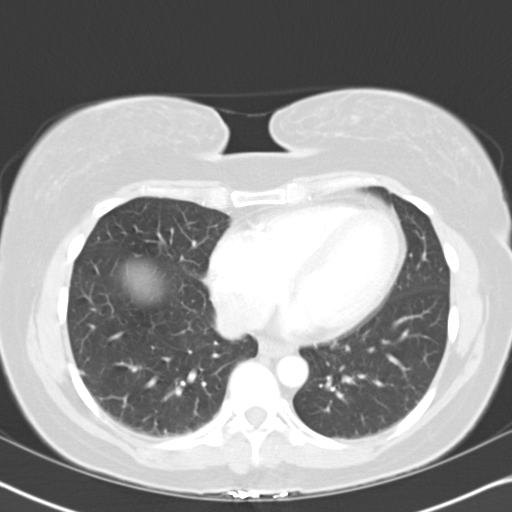
[im 26/57  lung]
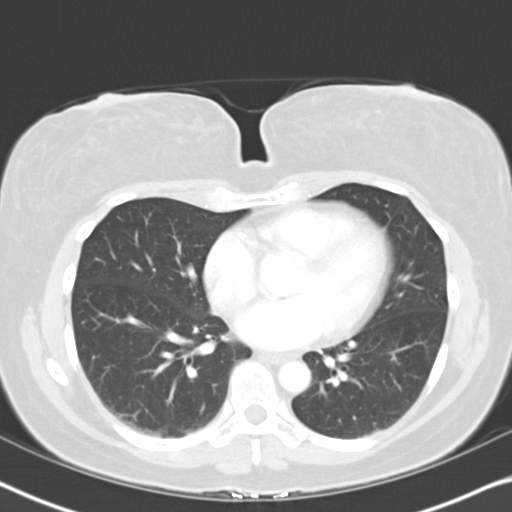
[im 31/57  lung]
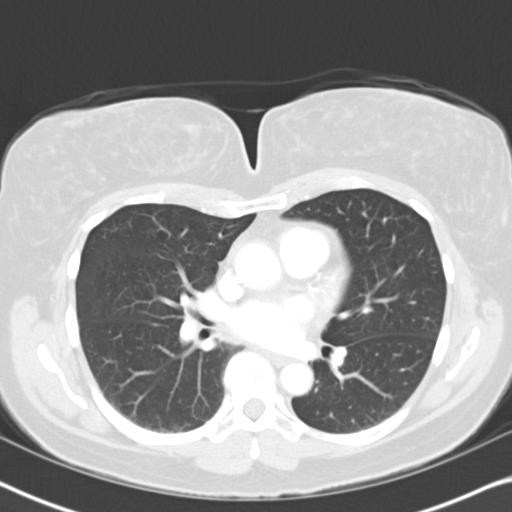
[im 35/57  lung]
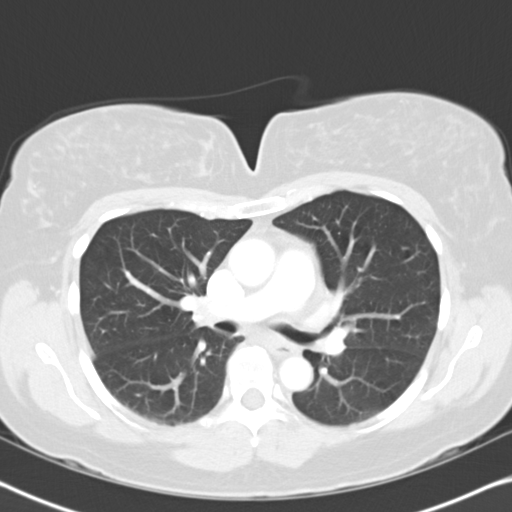
[im 39/57  mediastinal]
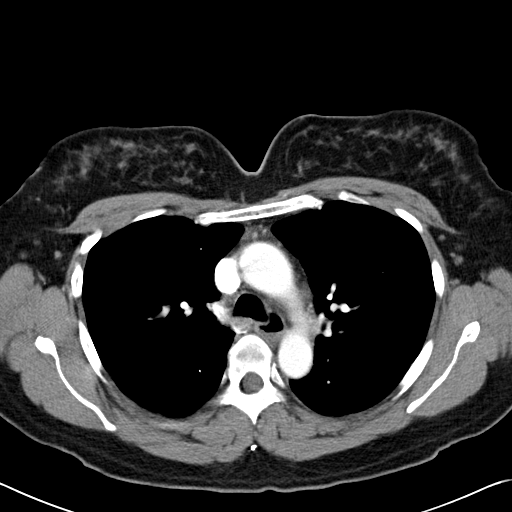
[im 39/57  lung]
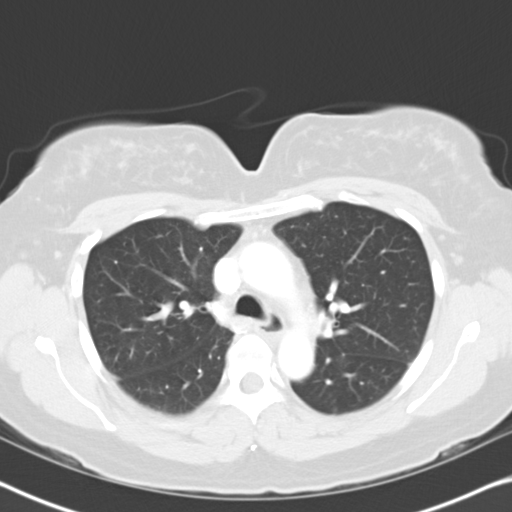
[im 44/57  lung]
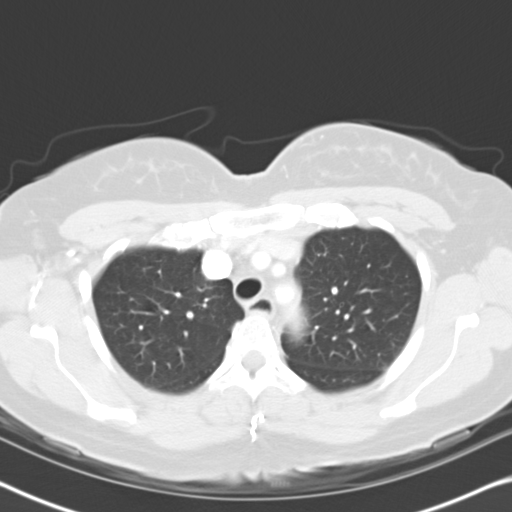
[im 48/57  lung]
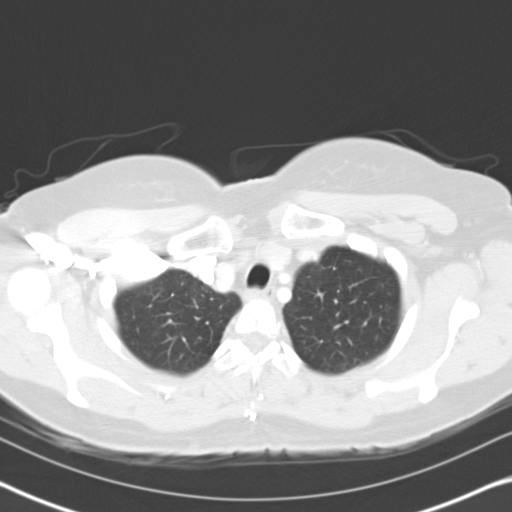
[im 52/57  lung]
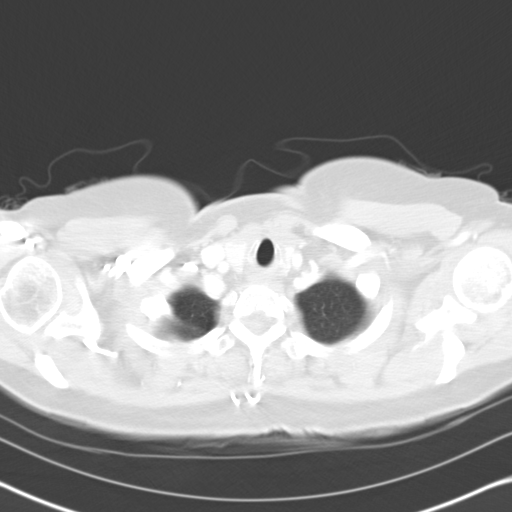

[Series 602: cor · coronal · 0.64mm/px · 3 of 96 slices shown]
[im 20/96  lung]
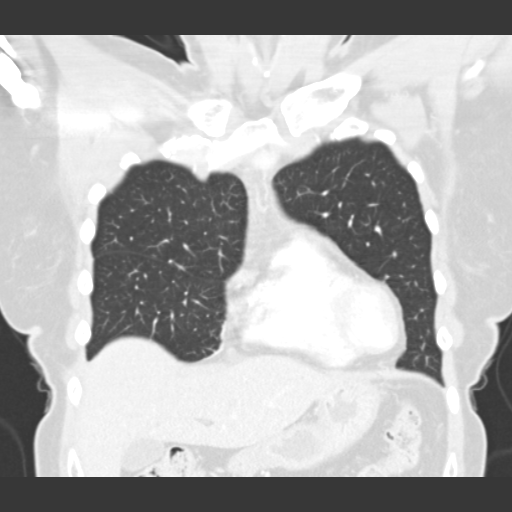
[im 39/96  lung]
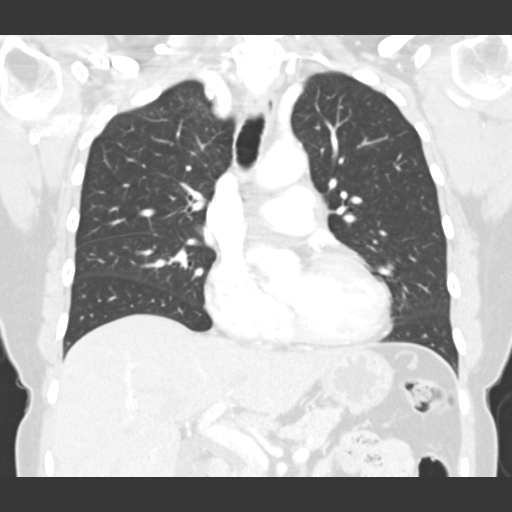
[im 58/96  lung]
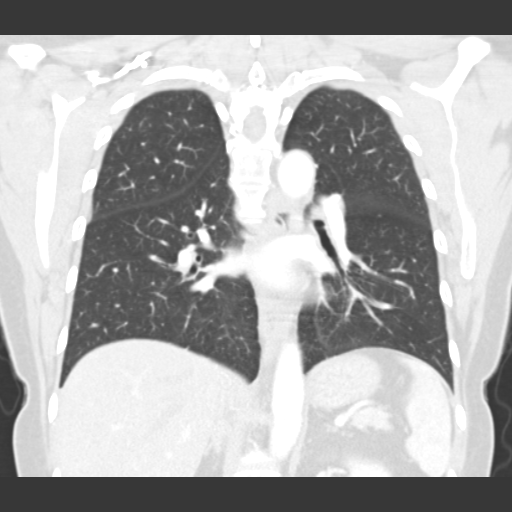

[15 of 36 positions shown; findings below may reference images not displayed]

FINDINGS: Mediastinum/Nodes: The heart is normal in size. No pericardial
effusion.

Coronary atherosclerosis.

No suspicious mediastinal, hilar, or axillary lymphadenopathy.

Visualized thyroid is unremarkable.

Lungs/Pleura: 5 x 3 mm subpleural nodule in the left lower lobe
(series 3/image 39). Additional 4 x 3 mm subpleural nodule at the
left lung base (series 3/image 47).

Minimal dependent atelectasis at the lung bases. No focal
consolidation.

No pleural effusion or pneumothorax.

Upper abdomen: Visualized upper abdomen is unremarkable.

Musculoskeletal: Degenerative changes of the visualized
thoracolumbar spine.
IMPRESSION: Two left lower lobe pulmonary nodules measuring up to 4 mm.

If this patient is considered high risk for primary bronchogenic
neoplasm, a single follow-up CT chest is suggested in 1 year. If low
risk, no dedicated follow-up imaging is required per [employer] guidelines.

This recommendation follows the consensus statement: Guidelines for
Management of Small Pulmonary Nodules Detected on CT Scans: A
Statement from the [HOSPITAL] as published in Radiology

## 2016-11-27 ENCOUNTER — Other Ambulatory Visit: Payer: Self-pay | Admitting: Cardiovascular Disease

## 2016-11-27 NOTE — Telephone Encounter (Signed)
Refill Request.  

## 2017-02-21 ENCOUNTER — Other Ambulatory Visit: Payer: Self-pay | Admitting: Cardiovascular Disease

## 2017-02-23 NOTE — Telephone Encounter (Signed)
Refill Request.  

## 2017-02-23 NOTE — Telephone Encounter (Signed)
REFILL 

## 2020-06-11 ENCOUNTER — Other Ambulatory Visit: Payer: Self-pay | Admitting: Endocrinology

## 2020-06-11 DIAGNOSIS — Z1231 Encounter for screening mammogram for malignant neoplasm of breast: Secondary | ICD-10-CM

## 2020-06-14 ENCOUNTER — Ambulatory Visit
Admission: RE | Admit: 2020-06-14 | Discharge: 2020-06-14 | Disposition: A | Discharge status: Home / Self Care | Payer: Medicaid Other | Source: Ambulatory Visit | Attending: Endocrinology | Admitting: Endocrinology

## 2020-06-14 DIAGNOSIS — Z1231 Encounter for screening mammogram for malignant neoplasm of breast: Secondary | ICD-10-CM

## 2021-07-31 ENCOUNTER — Other Ambulatory Visit: Payer: Self-pay | Admitting: Endocrinology

## 2021-07-31 DIAGNOSIS — Z1231 Encounter for screening mammogram for malignant neoplasm of breast: Secondary | ICD-10-CM
